# Patient Record
Sex: Male | Born: 1970
Health system: Southern US, Community
[De-identification: ages and names within clinical notes are randomized; demographics above are authoritative.]

## PROBLEM LIST (undated history)

## (undated) DIAGNOSIS — E119 Type 2 diabetes mellitus without complications: Secondary | ICD-10-CM

## (undated) DIAGNOSIS — I10 Essential (primary) hypertension: Secondary | ICD-10-CM

## (undated) DIAGNOSIS — I160 Hypertensive urgency: Secondary | ICD-10-CM

## (undated) HISTORY — PX: LACERATION REPAIR: SHX5168

## (undated) HISTORY — PX: SHOULDER SURGERY: SHX246

---

## 2010-09-19 ENCOUNTER — Emergency Department (HOSPITAL_COMMUNITY)
Admission: EM | Admit: 2010-09-19 | Discharge: 2010-09-19 | Disposition: A | Discharge status: Home / Self Care | Payer: PRIVATE HEALTH INSURANCE | Attending: Emergency Medicine | Admitting: Emergency Medicine

## 2010-09-19 DIAGNOSIS — R319 Hematuria, unspecified: Secondary | ICD-10-CM | POA: Insufficient documentation

## 2010-09-19 DIAGNOSIS — R51 Headache: Secondary | ICD-10-CM | POA: Insufficient documentation

## 2010-09-19 DIAGNOSIS — I10 Essential (primary) hypertension: Secondary | ICD-10-CM | POA: Insufficient documentation

## 2010-09-19 LAB — URINALYSIS, ROUTINE W REFLEX MICROSCOPIC
Bilirubin Urine: NEGATIVE
Leukocytes, UA: NEGATIVE
Nitrite: NEGATIVE
Specific Gravity, Urine: 1.02 (ref 1.005–1.030)
pH: 6.5 (ref 5.0–8.0)

## 2010-09-19 LAB — URINE MICROSCOPIC-ADD ON

## 2011-02-27 ENCOUNTER — Emergency Department (HOSPITAL_COMMUNITY)
Admission: EM | Admit: 2011-02-27 | Discharge: 2011-02-27 | Disposition: A | Discharge status: Home / Self Care | Payer: Worker's Compensation | Attending: Emergency Medicine | Admitting: Emergency Medicine

## 2011-02-27 ENCOUNTER — Emergency Department (HOSPITAL_COMMUNITY): Payer: Worker's Compensation

## 2011-02-27 ENCOUNTER — Encounter: Payer: Self-pay | Admitting: Emergency Medicine

## 2011-02-27 DIAGNOSIS — S29011A Strain of muscle and tendon of front wall of thorax, initial encounter: Secondary | ICD-10-CM

## 2011-02-27 DIAGNOSIS — IMO0002 Reserved for concepts with insufficient information to code with codable children: Secondary | ICD-10-CM | POA: Insufficient documentation

## 2011-02-27 DIAGNOSIS — I10 Essential (primary) hypertension: Secondary | ICD-10-CM | POA: Insufficient documentation

## 2011-02-27 DIAGNOSIS — X500XXA Overexertion from strenuous movement or load, initial encounter: Secondary | ICD-10-CM | POA: Insufficient documentation

## 2011-02-27 HISTORY — DX: Essential (primary) hypertension: I10

## 2011-02-27 MED ORDER — METHOCARBAMOL 500 MG PO TABS: ORAL_TABLET | ORAL | Status: DC

## 2011-02-27 MED ORDER — HYDROCODONE-ACETAMINOPHEN 7.5-325 MG PO TABS: ORAL_TABLET | ORAL | Status: DC

## 2011-02-27 NOTE — ED Notes (Signed)
Patient c/o left rib pain. Patient states "I was lifting some heavy freight this morning and heard a pop. I don't know if I cracked a rib or pulled a muscle but it hurts." Patient reports pain increases with deep breath.

## 2011-02-27 NOTE — ED Provider Notes (Signed)
History     Chief Complaint  Patient presents with  . Rib Injury   HPI Comments: Pt states he was lifting heavy freight this AM and injured his left lower rib area. No SOB, but pain with deep breathing.  Patient is a 40 y.o. male presenting with chest pain. The history is provided by the patient.  Chest Pain The chest pain began 3 - 5 hours ago. Chest pain occurs constantly. The chest pain is worsening. The pain is associated with breathing, coughing and lifting. At its most intense, the pain is at 10/10. The pain is currently at 10/10. The severity of the pain is severe. The quality of the pain is described as sharp. The pain does not radiate. Chest pain is worsened by certain positions and deep breathing. Pertinent negatives for primary symptoms include no fever, no fatigue, no syncope, no shortness of breath, no cough, no wheezing, no palpitations, no abdominal pain, no nausea, no vomiting, no dizziness and no altered mental status.  Pertinent negatives for associated symptoms include no claudication, no diaphoresis, no lower extremity edema, no near-syncope and no weakness. He tried nothing for the symptoms. Risk factors include smoking/tobacco exposure (hypertensive).  His past medical history is significant for hypertension.  Pertinent negatives for past medical history include no seizures.  Pertinent negatives for family medical history include: no early MI in family and no PE in family.     Past Medical History  Diagnosis Date  . Hypertension     Past Surgical History  Procedure Date  . Laceration repair     R thumb  . Laceration repair L inner thigh    Family History  Problem Relation Age of Onset  . COPD Mother   . Cancer Other     History  Substance Use Topics  . Smoking status: Never Smoker   . Smokeless tobacco: Never Used  . Alcohol Use: No      Review of Systems  Constitutional: Negative for fever, diaphoresis, activity change and fatigue.       All ROS  Neg except as noted in HPI  HENT: Negative for nosebleeds and neck pain.   Eyes: Negative for photophobia and discharge.  Respiratory: Negative for cough, chest tightness, shortness of breath and wheezing.   Cardiovascular: Positive for chest pain. Negative for palpitations, claudication, leg swelling, syncope and near-syncope.  Gastrointestinal: Negative for nausea, vomiting, abdominal pain and blood in stool.  Genitourinary: Negative for dysuria, frequency and hematuria.  Musculoskeletal: Negative for back pain and arthralgias.  Skin: Negative.   Neurological: Negative for dizziness, seizures, speech difficulty and weakness.  Psychiatric/Behavioral: Negative for hallucinations, confusion and altered mental status.    Physical Exam  BP 137/73  Pulse 66  Temp(Src) 97.7 F (36.5 C) (Oral)  Resp 20  Ht 5\' 10"  (1.778 m)  Wt 240 lb (108.863 kg)  BMI 34.44 kg/m2  SpO2 99%  Physical Exam  Nursing note and vitals reviewed. Constitutional: He is oriented to person, place, and time. He appears well-developed and well-nourished.  Non-toxic appearance.  HENT:  Head: Normocephalic.  Right Ear: Tympanic membrane and external ear normal.  Left Ear: Tympanic membrane and external ear normal.  Eyes: EOM and lids are normal. Pupils are equal, round, and reactive to light.  Neck: Normal range of motion. Neck supple. Carotid bruit is not present.  Cardiovascular: Normal rate, regular rhythm, normal heart sounds, intact distal pulses and normal pulses.   Pulmonary/Chest: Breath sounds normal. No respiratory distress. He exhibits  tenderness. He exhibits no mass, no crepitus, no deformity, no swelling and no retraction.  Abdominal: Soft. Bowel sounds are normal. There is no tenderness. There is no guarding.  Musculoskeletal: Normal range of motion.  Lymphadenopathy:       Head (right side): No submandibular adenopathy present.       Head (left side): No submandibular adenopathy present.    He has  no cervical adenopathy.  Neurological: He is alert and oriented to person, place, and time. He has normal strength. No cranial nerve deficit or sensory deficit.  Skin: Skin is warm and dry.  Psychiatric: He has a normal mood and affect. His speech is normal.    ED Course  Procedures  MDM I have reviewed nursing notes, vital signs, and all appropriate lab and imaging results for this patient.      Kathie Dike, [place] 02/27/11 1241

## 2011-02-27 NOTE — Discharge Instructions (Signed)
Your xrays are negative for fracture or acute problem. Please rest your chest wall. Use medications as suggested. Use with caution. These may cause drowsiness.Muscle Strain (Pulled Muscle) A pulled muscle happens when a muscle is over-stretched. Recovery usually takes 5 to 6 weeks.   HOME CARE   Put ice on the injured area.   Put ice in a plastic bag.   Place a towel between your skin and the bag.   Leave the ice on for 20 minutes at a time, every hour for the first 2 days.   Do not use the pulled muscle for several days or until your doctor says you can. Do not use the muscle if you have pain.   Wrap the injured area with an elastic bandage for comfort. Do not to put it on too tightly.   Only take medicine as told by your doctor.   Warm up before exercise. This helps prevent muscle strains.  GET HELP IF: There is increased pain or puffiness (swelling) in the affected area. MAKE SURE YOU:   Understand these instructions.   Will watch your condition.   Will get help right away if you are not doing well or get worse.  Document Released: 04/27/2008 Document Re-Released: 01/06/2010 [facility] Patient Information 2011 [place], [place].

## 2011-02-27 NOTE — ED Notes (Signed)
Pt was moving heavy equipment this am at work when he felt pain in his left rib area, pain is increased with resp. And movement, lung sounds clear and equal bilateral

## 2011-04-11 NOTE — ED Provider Notes (Signed)
Medical screening examination/treatment/procedure(s) were performed by non-physician practitioner and as supervising physician I was immediately available for consultation/collaboration.  Doug Sou, MD 04/11/11 [phone]

## 2011-05-06 ENCOUNTER — Emergency Department (HOSPITAL_COMMUNITY): Payer: PRIVATE HEALTH INSURANCE

## 2011-05-06 ENCOUNTER — Emergency Department (HOSPITAL_COMMUNITY)
Admission: EM | Admit: 2011-05-06 | Discharge: 2011-05-07 | Disposition: A | Discharge status: Home / Self Care | Payer: PRIVATE HEALTH INSURANCE | Attending: Emergency Medicine | Admitting: Emergency Medicine

## 2011-05-06 ENCOUNTER — Encounter (HOSPITAL_COMMUNITY): Payer: Self-pay | Admitting: *Deleted

## 2011-05-06 DIAGNOSIS — R110 Nausea: Secondary | ICD-10-CM | POA: Insufficient documentation

## 2011-05-06 DIAGNOSIS — R059 Cough, unspecified: Secondary | ICD-10-CM | POA: Insufficient documentation

## 2011-05-06 DIAGNOSIS — E876 Hypokalemia: Secondary | ICD-10-CM | POA: Insufficient documentation

## 2011-05-06 DIAGNOSIS — R51 Headache: Secondary | ICD-10-CM | POA: Insufficient documentation

## 2011-05-06 DIAGNOSIS — Z79899 Other long term (current) drug therapy: Secondary | ICD-10-CM | POA: Insufficient documentation

## 2011-05-06 DIAGNOSIS — R05 Cough: Secondary | ICD-10-CM | POA: Insufficient documentation

## 2011-05-06 DIAGNOSIS — R198 Other specified symptoms and signs involving the digestive system and abdomen: Secondary | ICD-10-CM | POA: Insufficient documentation

## 2011-05-06 DIAGNOSIS — K5900 Constipation, unspecified: Secondary | ICD-10-CM | POA: Insufficient documentation

## 2011-05-06 DIAGNOSIS — R1032 Left lower quadrant pain: Secondary | ICD-10-CM | POA: Insufficient documentation

## 2011-05-06 LAB — COMPREHENSIVE METABOLIC PANEL
AST: 26 U/L (ref 0–37)
Albumin: 3.8 g/dL (ref 3.5–5.2)
Alkaline Phosphatase: 60 U/L (ref 39–117)
Chloride: 95 mEq/L — ABNORMAL LOW (ref 96–112)
Creatinine, Ser: 1.05 mg/dL (ref 0.50–1.35)
Potassium: 3 mEq/L — ABNORMAL LOW (ref 3.5–5.1)
Total Bilirubin: 1.4 mg/dL — ABNORMAL HIGH (ref 0.3–1.2)

## 2011-05-06 LAB — CBC
MCHC: 35.7 g/dL (ref 30.0–36.0)
RDW: 13.1 % (ref 11.5–15.5)

## 2011-05-06 LAB — DIFFERENTIAL
Basophils Absolute: 0 10*3/uL (ref 0.0–0.1)
Basophils Relative: 1 % (ref 0–1)
Neutro Abs: 4.7 10*3/uL (ref 1.7–7.7)
Neutrophils Relative %: 66 % (ref 43–77)

## 2011-05-06 LAB — LIPASE, BLOOD: Lipase: 41 U/L (ref 11–59)

## 2011-05-06 MED ORDER — IOHEXOL 300 MG/ML  SOLN
100.0000 mL | Freq: Once | INTRAMUSCULAR | Status: AC | PRN
  Administered 2011-05-06: 100 mL via INTRAVENOUS

## 2011-05-06 MED ORDER — ONDANSETRON HCL 4 MG/2ML IJ SOLN
4.0000 mg | Freq: Once | INTRAMUSCULAR | Status: AC
  Administered 2011-05-06: 4 mg via INTRAVENOUS
  Filled 2011-05-06: qty 2

## 2011-05-06 MED ORDER — MORPHINE SULFATE 4 MG/ML IJ SOLN
4.0000 mg | Freq: Once | INTRAMUSCULAR | Status: AC
  Administered 2011-05-06: 4 mg via INTRAVENOUS
  Filled 2011-05-06: qty 1

## 2011-05-06 MED ORDER — IBUPROFEN 800 MG PO TABS
800.0000 mg | ORAL_TABLET | Freq: Once | ORAL | Status: AC
  Administered 2011-05-06: 800 mg via ORAL
  Filled 2011-05-06: qty 1

## 2011-05-06 MED ORDER — SODIUM CHLORIDE 0.9 % IV BOLUS (SEPSIS)
1000.0000 mL | Freq: Once | INTRAVENOUS | Status: AC
  Administered 2011-05-06: 1000 mL via INTRAVENOUS

## 2011-05-06 NOTE — ED Notes (Signed)
Pt reports nausea, headache and chest pain x 3 days

## 2011-05-06 NOTE — ED Notes (Signed)
Patient moved in room 7; c/o nausea at this time.  Family member at bedside.

## 2011-05-07 ENCOUNTER — Encounter (HOSPITAL_COMMUNITY): Payer: Self-pay | Admitting: Emergency Medicine

## 2011-05-07 LAB — URINALYSIS, ROUTINE W REFLEX MICROSCOPIC
Glucose, UA: NEGATIVE mg/dL
pH: 6 (ref 5.0–8.0)

## 2011-05-07 LAB — URINE MICROSCOPIC-ADD ON

## 2011-05-07 MED ORDER — POTASSIUM CHLORIDE CRYS ER 20 MEQ PO TBCR
40.0000 meq | EXTENDED_RELEASE_TABLET | Freq: Once | ORAL | Status: AC
  Administered 2011-05-07: 40 meq via ORAL
  Filled 2011-05-07: qty 2

## 2011-05-07 MED ORDER — POTASSIUM CHLORIDE ER 10 MEQ PO TBCR: 20.0000 meq | EXTENDED_RELEASE_TABLET | Freq: Two times a day (BID) | ORAL | Status: DC

## 2011-05-07 MED ORDER — ONDANSETRON HCL 4 MG PO TABS: 4.0000 mg | ORAL_TABLET | Freq: Four times a day (QID) | ORAL | Status: AC

## 2011-05-07 NOTE — ED Provider Notes (Signed)
History     CSN: [id number] Arrival date & time: 05/06/2011  8:09 PM  Chief Complaint  Patient presents with  . Headache  . Nausea    (Consider location/radiation/quality/duration/timing/severity/associated sxs/prior treatment) HPI Comments: Patient c/o left lower abdominal pain and nausea for 3 days.  Describes the pain as persistent, sharp and worsens when he tries to eat.  He also reports intermittent headaches, cough and chest congestion for several days.  He denies vomiting but states the nausea has been severe and his appetite has decreased.  He also denies fever, diarrhea, dysuria, or flank pain.  He also denies hematuria, shortness of breath, chest pain, neck pain, or visual changes  Patient is a 40 y.o. male presenting with abdominal pain. The history is provided by the patient.  Abdominal Pain The primary symptoms of the illness include abdominal pain and nausea. The primary symptoms of the illness do not include fever, fatigue, shortness of breath, vomiting, diarrhea, hematochezia or dysuria. The current episode started more than 2 days ago. The onset of the illness was gradual. The problem has not changed since onset. The pain came on gradually. The abdominal pain has been unchanged since its onset. The abdominal pain is located in the LLQ. The abdominal pain does not radiate. The abdominal pain is relieved by nothing. Exacerbated by: nothing.  Nausea began 3 to 5 days ago. The nausea is associated with eating. The nausea is exacerbated by food.  The patient has had a change in bowel habit. Additional symptoms associated with the illness include constipation. Symptoms associated with the illness do not include chills, diaphoresis, heartburn, urgency, hematuria, frequency or back pain. Significant associated medical issues do not include PUD, diabetes, substance abuse or cardiac disease.    Past Medical History  Diagnosis Date  . Hypertension     Past Surgical History    Procedure Date  . Laceration repair     R thumb  . Laceration repair L inner thigh    Family History  Problem Relation Age of Onset  . COPD Mother   . Cancer Other     History  Substance Use Topics  . Smoking status: Never Smoker   . Smokeless tobacco: Never Used  . Alcohol Use: No      Review of Systems  Constitutional: Positive for appetite change. Negative for fever, chills, diaphoresis, activity change and fatigue.  HENT: Positive for congestion. Negative for sore throat, trouble swallowing, neck pain and neck stiffness.   Respiratory: Positive for cough. Negative for chest tightness, shortness of breath and wheezing.   Cardiovascular: Negative for chest pain, palpitations and leg swelling.  Gastrointestinal: Positive for nausea, abdominal pain and constipation. Negative for heartburn, vomiting, diarrhea, blood in stool, hematochezia and anal bleeding.  Genitourinary: Negative for dysuria, urgency, frequency, hematuria, flank pain, decreased urine volume and difficulty urinating.  Musculoskeletal: Negative for myalgias, back pain and arthralgias.  Skin: Negative for rash.  Neurological: Positive for headaches. Negative for dizziness, facial asymmetry, weakness and numbness.  Hematological: Does not bruise/bleed easily.  All other systems reviewed and are negative.    Allergies  Penicillins and Mushroom extract complex  Home Medications   Current Outpatient Rx  Name Route Sig Dispense Refill  . CLONIDINE HCL 0.2 MG PO TABS Oral Take 0.2 mg by mouth 2 (two) times daily.      Marland Kitchen LISINOPRIL 20 MG PO TABS Oral Take 20 mg by mouth daily.      Marland Kitchen HYDROCODONE-ACETAMINOPHEN 7.5-325 MG PO TABS  1 po q4h prn pain 20 tablet 0  . IBUPROFEN 200 MG PO TABS Oral Take 400 mg by mouth every 6 (six) hours as needed. Pain     . METHOCARBAMOL 500 MG PO TABS  2 po tid for spasm 30 tablet 0    BP 130/104  Pulse 83  Temp(Src) 98.5 F (36.9 C) (Oral)  Resp 16  Ht 5\' 10"  (1.778 m)   Wt 230 lb (104.327 kg)  BMI 33.00 kg/m2  SpO2 99%  Physical Exam  Nursing note and vitals reviewed. Constitutional: He is oriented to person, place, and time. He appears well-developed and well-nourished. No distress.  HENT:  Head: Normocephalic and atraumatic.  Mouth/Throat: Oropharynx is clear and moist.  Eyes: EOM are normal. Pupils are equal, round, and reactive to light.  Neck: Normal range of motion. Neck supple.  Cardiovascular: Normal rate, regular rhythm and normal heart sounds.   No murmur heard. Pulmonary/Chest: Effort normal and breath sounds normal. No respiratory distress. He exhibits no tenderness.  Abdominal: Soft. Bowel sounds are normal. He exhibits no distension and no mass. There is no hepatosplenomegaly. There is tenderness in the left lower quadrant. There is no rigidity, no rebound, no guarding and no CVA tenderness.  Musculoskeletal: Normal range of motion. He exhibits no edema and no tenderness.  Lymphadenopathy:    He has no cervical adenopathy.  Neurological: He is alert and oriented to person, place, and time. No cranial nerve deficit. He exhibits normal muscle tone. Coordination normal.  Skin: Skin is warm and dry.    ED Course  Procedures (including critical care time)  Results for orders placed during the hospital encounter of 05/06/11  CBC      Component Value Range   WBC 7.1  4.0 - 10.5 (K/uL)   RBC 5.17  4.22 - 5.81 (MIL/uL)   Hemoglobin 15.3  13.0 - 17.0 (g/dL)   HCT 16.1  09.6 - 04.5 (%)   MCV 83.0  78.0 - 100.0 (fL)   MCH 29.6  26.0 - 34.0 (pg)   MCHC 35.7  30.0 - 36.0 (g/dL)   RDW 40.9  81.1 - 91.4 (%)   Platelets 180  150 - 400 (K/uL)  DIFFERENTIAL      Component Value Range   Neutrophils Relative 66  43 - 77 (%)   Neutro Abs 4.7  1.7 - 7.7 (K/uL)   Lymphocytes Relative 21  12 - 46 (%)   Lymphs Abs 1.5  0.7 - 4.0 (K/uL)   Monocytes Relative 12  3 - 12 (%)   Monocytes Absolute 0.9  0.1 - 1.0 (K/uL)   Eosinophils Relative 0  0 - 5 (%)    Eosinophils Absolute 0.0  0.0 - 0.7 (K/uL)   Basophils Relative 1  0 - 1 (%)   Basophils Absolute 0.0  0.0 - 0.1 (K/uL)  COMPREHENSIVE METABOLIC PANEL      Component Value Range   Sodium 131 (*) 135 - 145 (mEq/L)   Potassium 3.0 (*) 3.5 - 5.1 (mEq/L)   Chloride 95 (*) 96 - 112 (mEq/L)   CO2 25  19 - 32 (mEq/L)   Glucose, Bld 96  70 - 99 (mg/dL)   BUN 17  6 - 23 (mg/dL)   Creatinine, Ser 7.82  0.50 - 1.35 (mg/dL)   Calcium 9.0  8.4 - 95.6 (mg/dL)   Total Protein 7.3  6.0 - 8.3 (g/dL)   Albumin 3.8  3.5 - 5.2 (g/dL)   AST 26  0 -  37 (U/L)   ALT 44  0 - 53 (U/L)   Alkaline Phosphatase 60  39 - 117 (U/L)   Total Bilirubin 1.4 (*) 0.3 - 1.2 (mg/dL)   GFR calc non Af Amer 86 (*) >90 (mL/min)   GFR calc Af Amer >90  >90 (mL/min)  URINALYSIS, ROUTINE W REFLEX MICROSCOPIC      Component Value Range   Color, Urine ORANGE (*) YELLOW    Appearance CLEAR  CLEAR    Specific Gravity, Urine 1.020  1.005 - 1.030    pH 6.0  5.0 - 8.0    Glucose, UA NEGATIVE  NEGATIVE (mg/dL)   Hgb urine dipstick TRACE (*) NEGATIVE    Bilirubin Urine SMALL (*) NEGATIVE    Ketones, ur TRACE (*) NEGATIVE (mg/dL)   Protein, ur TRACE (*) NEGATIVE (mg/dL)   Urobilinogen, UA >4.0 (*) 0.0 - 1.0 (mg/dL)   Nitrite NEGATIVE  NEGATIVE    Leukocytes, UA NEGATIVE  NEGATIVE   LIPASE, BLOOD      Component Value Range   Lipase 41  11 - 59 (U/L)  URINE MICROSCOPIC-ADD ON      Component Value Range   WBC, UA 3-6  <3 (WBC/hpf)   RBC / HPF 3-6  <3 (RBC/hpf)   Bacteria, UA RARE  RARE    Casts HYALINE CASTS (*) NEGATIVE     Dg Chest 2 View  05/06/2011  *RADIOLOGY REPORT*  Clinical Data: Nausea, headache and chest pain.  CHEST - 2 VIEW  Comparison: 02/27/2011.  Findings: There is an ill-defined nodular density overlying the right anterior fifth rib end.  Follow-up noncontrast chest CT recommended.  There is abnormal articulation between the first and second right ribs, which is unchanged compared to prior.  There is no  airspace disease.  No effusion.  Cardiopericardial silhouette is normal.  Mediastinal contours normal.  IMPRESSION:  1.  No acute cardiopulmonary disease. 2.  Ill-defined pulmonary nodule measuring 8 mm overlying the right anterior fifth rib end.  Follow-up nonemergent noncontrast chest CT recommended to further assess.  Original Report Authenticated By: Andreas Newport, M.D.   Ct Abdomen Pelvis W Contrast  05/07/2011  *RADIOLOGY REPORT*  Clinical Data: Nausea and diffuse abdominal pain  CT ABDOMEN AND PELVIS WITH CONTRAST  Technique:  Multidetector CT imaging of the abdomen and pelvis was performed following the standard protocol during bolus administration of intravenous contrast.  Contrast: OMNIPAQUE IOHEXOL 300 MG/ML IV SOLN  Comparison: None.  Findings:  Normal hepatic contour. There is diffuse mildly decreased attenuation of the hepatic parenchyma, nonspecific but suggestive of hepatic steatosis.  No discrete hyper hypoattenuating hepatic lesions.  Normal gallbladder.  No intra or extrahepatic biliary duct dilatation.  No ascites.  There is symmetric enhancement and excretion of the bilateral kidneys.  No evidence of urinary obstruction.  There is an approximately 5 mm nonobstructing stone within a superior calix of the left kidney (coronal image 71, series 4).  Bilateral sub centimeter hypoattenuating renal lesionsm too small to accurately characterize though favored to represent renal cysts.  No perinephric stranding.  Bilateral adrenal glands are normal. Normal pancreas and spleen.  Incidental note is made of a small splenule.  Enteric contrast extends to the mid/distal small bowel.  No evidence of enteric obstruction.  The sigmoid colon is noted to be redundant but free of significant diverticular disease.  The bowel is otherwise normal in course and caliber without wall thickening or evidence of obstruction.  Normal appendix.  No pneumoperitoneum, pneumatosis or  portal venous gas.  Normal caliber  abdominal aorta. The major branch vessels of the abdominal aorta are patent.  No retroperitoneal, mesenteric, pelvic or inguinal lymphadenopathy. Pelvic organs are normal.  No free fluid within the pelvis  Limited visualization of the lower thorax is negative for focal airspace opacity or pleural effusion.  Normal heart size.  No pericardial effusion.  No acute aggressive osseous abnormalities.  IMPRESSION: 1.  No explanation for the patient's diffuse abdominal pain and nausea.  Specifically, no evidence of urinary or enteric obstruction. 2.  Nonobstructing left-sided 5 mm renal stone. 3.  Findings suggestive of hepatic steatosis.  Original Report Authenticated By: Waynard Reeds, M.D.        MDM     Patient is feeling better, has received IVF's and nausea resolved after the zofran.  I have discussed tonight lab and imagining results with the patient including the total bili.  He stated that he was told by one of his previous provider's that his liver functions were slightly elevated in the past.    I have recommended he have close f/u with his PMD for recheck and he agrees to care plan and verbalized understanding.     Pt feels improved after observation and/or treatment in ED.     Telisha Zawadzki L. Aquilla Voiles, PA 05/07/11 0116  Josean Lycan L. [place], [place] 05/07/11 [phone]

## 2011-05-07 NOTE — ED Provider Notes (Signed)
Medical screening examination/treatment/procedure(s) were performed by non-physician practitioner and as supervising physician I was immediately available for consultation/collaboration.  Glynn Octave, MD 05/07/11 1201

## 2011-07-07 ENCOUNTER — Encounter: Payer: Self-pay | Admitting: Urgent Care

## 2011-07-16 ENCOUNTER — Ambulatory Visit: Payer: Self-pay | Admitting: Urgent Care

## 2011-07-16 ENCOUNTER — Telehealth: Payer: Self-pay | Admitting: Urgent Care

## 2011-07-16 NOTE — Telephone Encounter (Signed)
Pt was a no show

## 2011-12-25 ENCOUNTER — Emergency Department (HOSPITAL_COMMUNITY)
Admission: EM | Admit: 2011-12-25 | Discharge: 2011-12-25 | Disposition: A | Discharge status: Home / Self Care | Payer: Worker's Compensation | Attending: Emergency Medicine | Admitting: Emergency Medicine

## 2011-12-25 ENCOUNTER — Emergency Department (HOSPITAL_COMMUNITY): Payer: Worker's Compensation

## 2011-12-25 ENCOUNTER — Encounter (HOSPITAL_COMMUNITY): Payer: Self-pay | Admitting: *Deleted

## 2011-12-25 DIAGNOSIS — X58XXXA Exposure to other specified factors, initial encounter: Secondary | ICD-10-CM | POA: Insufficient documentation

## 2011-12-25 DIAGNOSIS — S335XXA Sprain of ligaments of lumbar spine, initial encounter: Secondary | ICD-10-CM | POA: Insufficient documentation

## 2011-12-25 DIAGNOSIS — R209 Unspecified disturbances of skin sensation: Secondary | ICD-10-CM | POA: Insufficient documentation

## 2011-12-25 DIAGNOSIS — M545 Low back pain, unspecified: Secondary | ICD-10-CM | POA: Insufficient documentation

## 2011-12-25 DIAGNOSIS — M5430 Sciatica, unspecified side: Secondary | ICD-10-CM | POA: Insufficient documentation

## 2011-12-25 DIAGNOSIS — I10 Essential (primary) hypertension: Secondary | ICD-10-CM | POA: Insufficient documentation

## 2011-12-25 DIAGNOSIS — M79609 Pain in unspecified limb: Secondary | ICD-10-CM | POA: Insufficient documentation

## 2011-12-25 MED ORDER — OXYCODONE-ACETAMINOPHEN 5-325 MG PO TABS
1.0000 | ORAL_TABLET | Freq: Four times a day (QID) | ORAL | Status: AC | PRN
Start: 2011-12-25 — End: 2012-01-04

## 2011-12-25 MED ORDER — METHYLPREDNISOLONE SODIUM SUCC 125 MG IJ SOLR
125.0000 mg | Freq: Once | INTRAMUSCULAR | Status: AC
Start: 2011-12-25 — End: 2011-12-25
  Administered 2011-12-25: 125 mg via INTRAVENOUS
  Filled 2011-12-25: qty 2

## 2011-12-25 MED ORDER — PREDNISONE 20 MG PO TABS: ORAL_TABLET | ORAL | Status: DC

## 2011-12-25 MED ORDER — CYCLOBENZAPRINE HCL 10 MG PO TABS: 10.0000 mg | ORAL_TABLET | Freq: Three times a day (TID) | ORAL | Status: AC | PRN

## 2011-12-25 MED ORDER — HYDROMORPHONE HCL PF 1 MG/ML IJ SOLN
1.0000 mg | Freq: Once | INTRAMUSCULAR | Status: AC
  Administered 2011-12-25: 1 mg via INTRAVENOUS
  Filled 2011-12-25: qty 1

## 2011-12-25 NOTE — ED Notes (Signed)
Pt c/o pain in his entire back and numbness and tingling in his legs since unloading paper at work at 5 am. Pt states that the numbness is worse in his right leg.

## 2011-12-25 NOTE — ED Notes (Signed)
Pt a/ox4. Resp even and unlabored. NAD at this time. D/C instructions reviewed with pt. Pt verbalized understanding. Pt to d/c desk via w/c. Mother with pt to transport home.

## 2011-12-25 NOTE — ED Notes (Signed)
Patient reports lower back pain that radiates to right leg after trying to lift a palate of paper this morning around 0500. Patient states the paper shifted and he was trying to right the stack. Patient states pain in legs is tingling and numb. Reports his legs "feel like they want to buckle" Patient denies loss of bowel/bladder control. Sitting up in stretcher at this time.

## 2011-12-25 NOTE — ED Notes (Signed)
Dr Zammit at bedside. 

## 2011-12-25 NOTE — ED Provider Notes (Signed)
History  Scribed for Jeffrey Lennert, MD, the patient was seen in room APA08/APA08. This chart was scribed by Jeffrey Potts. The patient's care started at 12:19 PM    CSN: [id number]  Arrival date & time 12/25/11  1134   First MD Initiated Contact with Patient 12/25/11 1215      Chief Complaint  Patient presents with  . Back Pain    Patient is a 41 y.o. male presenting with back pain.  Back Pain  This is a new problem. The current episode started 6 to 12 hours ago. The problem occurs constantly. The problem has not changed since onset.The pain is associated with lifting heavy objects. The pain is present in the lumbar spine. Radiates to: down both legs. The pain is moderate. The symptoms are aggravated by certain positions. Associated symptoms include numbness (numbness and tingling in both legs). He has tried nothing for the symptoms. The treatment provided no relief.   Jeffrey Potts is a 41 y.o. male who presents to the Emergency Department complaining of back pain after lifting a heavy object this morning.  Pt states that he is also experiencing numbness, pain, and tingling in his legs with his right leg feeling worse.  Nothing seems to make the pain better or worse.   Past Medical History  Diagnosis Date  . Hypertension     Past Surgical History  Procedure Date  . Laceration repair     R thumb  . Laceration repair L inner thigh    Family History  Problem Relation Age of Onset  . COPD Mother   . Cancer Other     History  Substance Use Topics  . Smoking status: Never Smoker   . Smokeless tobacco: Never Used  . Alcohol Use: No      Review of Systems  HENT: Positive for neck pain.   Musculoskeletal: Positive for back pain.  Neurological: Positive for numbness (numbness and tingling in both legs).  All other systems reviewed and are negative.    Allergies  Penicillins and Mushroom extract complex  Home Medications   Current Outpatient Rx  Name Route Sig  Dispense Refill  . CLONIDINE HCL 0.2 MG PO TABS Oral Take 0.2 mg by mouth 2 (two) times daily.      Marland Kitchen HYDROCODONE-ACETAMINOPHEN 7.5-325 MG PO TABS  1 po q4h prn pain 20 tablet 0  . IBUPROFEN 200 MG PO TABS Oral Take 400 mg by mouth every 6 (six) hours as needed. Pain     . LISINOPRIL 20 MG PO TABS Oral Take 20 mg by mouth daily.      Marland Kitchen METHOCARBAMOL 500 MG PO TABS  2 po tid for spasm 30 tablet 0  . POTASSIUM CHLORIDE ER 10 MEQ PO TBCR Oral Take 2 tablets (20 mEq total) by mouth 2 (two) times daily. 8 tablet 0    BP 140/101  Pulse 91  Temp(Src) 98.7 F (37.1 C) (Oral)  Resp 20  Ht 5\' 10"  (1.778 m)  Wt 240 lb (108.863 kg)  BMI 34.44 kg/m2  SpO2 100%  Physical Exam  Nursing note and vitals reviewed. Constitutional: He is oriented to person, place, and time. He appears well-developed and well-nourished. No distress.  HENT:  Head: Normocephalic and atraumatic.  Eyes: EOM are normal. Pupils are equal, round, and reactive to light.  Neck: Neck supple. No tracheal deviation present.  Cardiovascular: Normal rate.   Pulmonary/Chest: Effort normal. No respiratory distress.  Abdominal: Soft. He exhibits no distension.  Musculoskeletal:  Normal range of motion. He exhibits tenderness. He exhibits no edema.       Tenderness in lumbar spine.  Decreases reflexes distally bilaterally.  Straight leg raise positive on the right side.   Neurological: He is alert and oriented to person, place, and time. No sensory deficit.  Skin: Skin is warm and dry.  Psychiatric: He has a normal mood and affect. His behavior is normal.    ED Course  Procedures   DIAGNOSTIC STUDIES: Oxygen Saturation is 100% on room air, normal by my interpretation.    COORDINATION OF CARE:     Labs Reviewed - No data to display Dg Lumbar Spine Complete  12/25/2011  *RADIOLOGY REPORT*  Clinical Data: Back pain.  LUMBAR SPINE - COMPLETE 4+ VIEW  Comparison: 05/06/2011  Findings: 6 mm left kidney upper pole renal calculus  noted.  The T12 ribs are hypoplastic.  Mild bilateral facet arthropathy noted at L5-S1.  No lumbar spine fracture or significant malalignment observed.  IMPRESSION:  1.  Bilateral facet arthropathy at L5-S1. 2.  Left kidney upper pole calculus.  Original Report Authenticated By: Dellia Cloud, M.D.     No diagnosis found.  Pt improved with tx  MDM  Lumbar strain with sciatica  The chart was scribed for me under my direct supervision.  I personally performed the history, physical, and medical decision making and all procedures in the evaluation of this patient.Jeffrey Lennert, MD 12/25/11 [phone]

## 2011-12-25 NOTE — Discharge Instructions (Signed)
Rest at home and follow up with your md tuesday

## 2014-03-12 ENCOUNTER — Ambulatory Visit: Payer: Self-pay

## 2015-01-25 ENCOUNTER — Emergency Department (HOSPITAL_COMMUNITY): Payer: BLUE CROSS/BLUE SHIELD

## 2015-01-25 ENCOUNTER — Encounter (HOSPITAL_COMMUNITY): Payer: Self-pay | Admitting: Emergency Medicine

## 2015-01-25 ENCOUNTER — Inpatient Hospital Stay (HOSPITAL_COMMUNITY)
Admission: EM | Admit: 2015-01-25 | Discharge: 2015-01-26 | DRG: 305 | Disposition: A | Discharge status: Home / Self Care | Payer: BLUE CROSS/BLUE SHIELD | Attending: Internal Medicine | Admitting: Internal Medicine

## 2015-01-25 DIAGNOSIS — Z825 Family history of asthma and other chronic lower respiratory diseases: Secondary | ICD-10-CM

## 2015-01-25 DIAGNOSIS — R42 Dizziness and giddiness: Secondary | ICD-10-CM | POA: Diagnosis not present

## 2015-01-25 DIAGNOSIS — Z9119 Patient's noncompliance with other medical treatment and regimen: Secondary | ICD-10-CM | POA: Diagnosis present

## 2015-01-25 DIAGNOSIS — R9431 Abnormal electrocardiogram [ECG] [EKG]: Secondary | ICD-10-CM

## 2015-01-25 DIAGNOSIS — I10 Essential (primary) hypertension: Principal | ICD-10-CM | POA: Diagnosis present

## 2015-01-25 DIAGNOSIS — R531 Weakness: Secondary | ICD-10-CM | POA: Diagnosis present

## 2015-01-25 DIAGNOSIS — I160 Hypertensive urgency: Secondary | ICD-10-CM | POA: Diagnosis present

## 2015-01-25 DIAGNOSIS — E669 Obesity, unspecified: Secondary | ICD-10-CM | POA: Diagnosis not present

## 2015-01-25 DIAGNOSIS — R519 Headache, unspecified: Secondary | ICD-10-CM

## 2015-01-25 DIAGNOSIS — R51 Headache: Secondary | ICD-10-CM

## 2015-01-25 HISTORY — DX: Hypertensive urgency: I16.0

## 2015-01-25 LAB — CBC
HCT: 45.6 % (ref 39.0–52.0)
Hemoglobin: 15.8 g/dL (ref 13.0–17.0)
MCH: 29.6 pg (ref 26.0–34.0)
MCHC: 34.6 g/dL (ref 30.0–36.0)
MCV: 85.4 fL (ref 78.0–100.0)
PLATELETS: 250 10*3/uL (ref 150–400)
RBC: 5.34 MIL/uL (ref 4.22–5.81)
RDW: 13.2 % (ref 11.5–15.5)
WBC: 9.3 10*3/uL (ref 4.0–10.5)

## 2015-01-25 LAB — COMPREHENSIVE METABOLIC PANEL
ALK PHOS: 115 U/L (ref 38–126)
ALT: 62 U/L (ref 17–63)
ANION GAP: 9 (ref 5–15)
AST: 34 U/L (ref 15–41)
Albumin: 4.2 g/dL (ref 3.5–5.0)
BILIRUBIN TOTAL: 0.7 mg/dL (ref 0.3–1.2)
BUN: 19 mg/dL (ref 6–20)
CO2: 24 mmol/L (ref 22–32)
Calcium: 8.9 mg/dL (ref 8.9–10.3)
Chloride: 103 mmol/L (ref 101–111)
Creatinine, Ser: 1 mg/dL (ref 0.61–1.24)
GFR calc non Af Amer: >60 mL/min (ref >60)
Glucose, Bld: 106 mg/dL — ABNORMAL HIGH (ref 65–99)
Potassium: 3.4 mmol/L — ABNORMAL LOW (ref 3.5–5.1)
Sodium: 136 mmol/L (ref 135–145)
Total Protein: 7.7 g/dL (ref 6.5–8.1)

## 2015-01-25 LAB — TSH: TSH: 1.603 u[IU]/mL (ref 0.350–4.500)

## 2015-01-25 LAB — MAGNESIUM: Magnesium: 2 mg/dL (ref 1.7–2.4)

## 2015-01-25 LAB — I-STAT TROPONIN, ED: Troponin i, poc: 0.02 ng/mL (ref 0.00–0.08)

## 2015-01-25 LAB — TROPONIN I
TROPONIN I: 0.03 ng/mL (ref <0.031)
Troponin I: <0.03 ng/mL (ref <0.031)

## 2015-01-25 MED ORDER — HYDROCODONE-ACETAMINOPHEN 5-325 MG PO TABS: 1.0000 | ORAL_TABLET | ORAL | Status: DC | PRN

## 2015-01-25 MED ORDER — POTASSIUM CHLORIDE CRYS ER 20 MEQ PO TBCR
20.0000 meq | EXTENDED_RELEASE_TABLET | Freq: Every day | ORAL | Status: DC
  Administered 2015-01-25 – 2015-01-26 (×2): 20 meq via ORAL
  Filled 2015-01-25 (×2): qty 1

## 2015-01-25 MED ORDER — ACETAMINOPHEN 325 MG PO TABS
650.0000 mg | ORAL_TABLET | Freq: Four times a day (QID) | ORAL | Status: DC | PRN
  Administered 2015-01-25: 650 mg via ORAL
  Filled 2015-01-25: qty 2

## 2015-01-25 MED ORDER — CLONIDINE HCL 0.2 MG PO TABS
0.2000 mg | ORAL_TABLET | Freq: Two times a day (BID) | ORAL | Status: DC
  Administered 2015-01-25 – 2015-01-26 (×3): 0.2 mg via ORAL
  Filled 2015-01-25 (×3): qty 1

## 2015-01-25 MED ORDER — ONDANSETRON HCL 4 MG/2ML IJ SOLN: 4.0000 mg | Freq: Four times a day (QID) | INTRAMUSCULAR | Status: DC | PRN

## 2015-01-25 MED ORDER — ALBUTEROL SULFATE (2.5 MG/3ML) 0.083% IN NEBU
2.5000 mg | INHALATION_SOLUTION | RESPIRATORY_TRACT | Status: DC | PRN
Start: 2015-01-25 — End: 2015-01-26

## 2015-01-25 MED ORDER — CLONIDINE HCL 0.1 MG/24HR TD PTWK
0.2000 mg | MEDICATED_PATCH | Freq: Once | TRANSDERMAL | Status: DC
  Filled 2015-01-25: qty 2

## 2015-01-25 MED ORDER — ONDANSETRON HCL 4 MG PO TABS: 4.0000 mg | ORAL_TABLET | Freq: Four times a day (QID) | ORAL | Status: DC | PRN

## 2015-01-25 MED ORDER — ALUM & MAG HYDROXIDE-SIMETH 200-200-20 MG/5ML PO SUSP: 30.0000 mL | Freq: Four times a day (QID) | ORAL | Status: DC | PRN

## 2015-01-25 MED ORDER — ACETAMINOPHEN 650 MG RE SUPP: 650.0000 mg | Freq: Four times a day (QID) | RECTAL | Status: DC | PRN

## 2015-01-25 MED ORDER — HYDROCHLOROTHIAZIDE 25 MG PO TABS
25.0000 mg | ORAL_TABLET | Freq: Every day | ORAL | Status: DC
  Administered 2015-01-25 – 2015-01-26 (×2): 25 mg via ORAL
  Filled 2015-01-25 (×2): qty 1

## 2015-01-25 MED ORDER — ASPIRIN EC 81 MG PO TBEC
81.0000 mg | DELAYED_RELEASE_TABLET | Freq: Every day | ORAL | Status: DC
  Administered 2015-01-25 – 2015-01-26 (×2): 81 mg via ORAL
  Filled 2015-01-25 (×2): qty 1

## 2015-01-25 MED ORDER — NICARDIPINE HCL IN NACL 20-0.86 MG/200ML-% IV SOLN
3.0000 mg/h | Freq: Once | INTRAVENOUS | Status: AC
Start: 2015-01-25 — End: 2015-01-25
  Administered 2015-01-25: 5 mg/h via INTRAVENOUS
  Filled 2015-01-25: qty 200

## 2015-01-25 MED ORDER — HYDRALAZINE HCL 20 MG/ML IJ SOLN
5.0000 mg | INTRAMUSCULAR | Status: DC | PRN
Start: 2015-01-25 — End: 2015-01-26

## 2015-01-25 MED ORDER — SODIUM CHLORIDE 0.9 % IV SOLN
INTRAVENOUS | Status: DC
  Administered 2015-01-25: 14:00:00 via INTRAVENOUS

## 2015-01-25 MED ORDER — LISINOPRIL-HYDROCHLOROTHIAZIDE 20-25 MG PO TABS: 1.0000 | ORAL_TABLET | Freq: Every day | ORAL | Status: DC

## 2015-01-25 MED ORDER — CLONIDINE HCL 0.2 MG PO TABS
0.2000 mg | ORAL_TABLET | Freq: Three times a day (TID) | ORAL | Status: AC | PRN
  Administered 2015-01-25: 0.2 mg via ORAL

## 2015-01-25 MED ORDER — MECLIZINE HCL 12.5 MG PO TABS: 12.5000 mg | ORAL_TABLET | Freq: Three times a day (TID) | ORAL | Status: DC | PRN

## 2015-01-25 MED ORDER — LISINOPRIL 10 MG PO TABS
20.0000 mg | ORAL_TABLET | Freq: Every day | ORAL | Status: DC
  Administered 2015-01-25 – 2015-01-26 (×2): 20 mg via ORAL
  Filled 2015-01-25 (×2): qty 2

## 2015-01-25 MED ORDER — CLONIDINE HCL 0.2 MG PO TABS
ORAL_TABLET | ORAL | Status: AC
  Administered 2015-01-25: 0.2 mg via ORAL
  Filled 2015-01-25: qty 1
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
## 2015-01-25 NOTE — H&P (Signed)
[facility] History and Physical  Jeffrey Potts OMV:[id number] DOB: 10-25-1970 DOA: 01/25/2015  Referring physician: ED physician, Jeffrey Potts PCP: Jeffrey Ribas, MD   Chief Complaint: Generalized weakness and dizziness.  HPI: Jeffrey Potts is a 44 y.o. male with a history of hypertension, who presents to the emergency department with a complaint of generalized weakness and dizziness which started a couple days ago. He also reports a global headache and generalized swelling in both ankles. He denies focal unilateral weakness, visual changes, nausea, vomiting, chest pain, shortness of breath, abdominal pain, nausea, vomiting, diarrhea, or pain with urination. He has not taken any of his blood pressure medications including lisinopril, HCTZ, and clonidine for 7 or 8 months due to being lost to follow-up because of a lack of insurance. The pharmacy would not let him refill his medications because he had not seen a physician in over 2 years. He had been previously seen by the physicians at [facility] and by cardiologist, Dr. Rennis Potts.  On arrival to the ED, his blood pressure was 232/114. His heart rate was 85, he was afebrile, and oxygenating 98% on room air. CT of his head revealed no acute intracranial findings. His chest x-ray revealed findings suggestive of airways disease/bronchitis, but no focal airspace opacities. His EKG revealed normal sinus rhythm, heart rate 75 beats for minute, and diffuse lateral T-wave changes. His lab data are significant only for serum potassium of 3.4. His troponin I is negative. He is being admitted for further evaluation and management.     Review of Systems:  As above in history present illness. In addition, he does have an occasional cough. Otherwise review of systems is negative.  Past Medical History  Diagnosis Date  . Hypertension    Past Surgical History  Procedure Laterality Date  . Laceration repair      R thumb  . Laceration repair  L  inner thigh   Social History:  reports that he has never smoked. He has never used smokeless tobacco. He reports that he does not drink alcohol or use illicit drugs.  Allergies  Allergen Reactions  . Mushroom Extract Complex Shortness Of Breath  . Penicillins Other (See Comments)    Dry Throat    Family History  Problem Relation Age of Onset  . COPD Mother   . Cancer Other     Prior to Admission medications   Medication Sig Start Date End Date Taking? Authorizing Provider  cloNIDine (CATAPRES) 0.2 MG tablet Take 0.2 mg by mouth 2 (two) times daily.      Historical Provider, MD  ibuprofen (ADVIL,MOTRIN) 200 MG tablet Take 400 mg by mouth every 6 (six) hours as needed. Pain     Historical Provider, MD  lisinopril-hydrochlorothiazide (PRINZIDE,ZESTORETIC) 20-25 MG per tablet Take 1 tablet by mouth daily.    Historical Provider, MD  naproxen sodium (ALEVE) 220 MG tablet Take 220 mg by mouth as needed. For pain    Historical Provider, MD  predniSONE (DELTASONE) 20 MG tablet One pill two times a day Patient not taking: Reported on 01/25/2015 12/25/11   Bethann Berkshire, MD   Physical Exam: Filed Vitals:   01/25/15 1300 01/25/15 1310 01/25/15 1325 01/25/15 1330  BP: 154/92 160/99 161/93 170/94  Pulse: 81 91 96 101  Temp:      TempSrc:      Resp: 18 15 17 23   Height:      Weight:      SpO2: 95% 96% 97% 99%  Wt Readings from Last 3 Encounters:  01/25/15 117.935 kg (260 lb)  12/25/11 108.863 kg (240 lb)  05/06/11 104.327 kg (230 lb)    General:  Appears calm and comfortable; 44 year old Caucasian man in no acute distress. Eyes: PERRL, normal lids, irises & conjunctiva; conjunctiva are slightly injected, sclerae are clear. ENT: grossly normal hearing, lips & tongue; oropharynx reveals poor dentition, moist mucous membranes. Neck: no LAD, masses or thyromegaly Cardiovascular: S1, S2, with a 1 to 2/6 systolic murmur. Trace of pedal edema bilaterally right greater than  left. Telemetry: SR, no arrhythmias  Respiratory: CTA bilaterally, no w/r/r. Normal respiratory effort. Abdomen: Obese, positive bowel sounds, soft, nontender, nondistended. Skin: no rash or induration seen on limited exam Musculoskeletal: grossly normal tone BUE/BLE; no acute hot red joints. Psychiatric: grossly normal mood and affect, speech fluent and appropriate Neurologic: Alert and oriented 3. Cranial nerves II through XII are intact. No significant nystagmus. Strength is 5 over 5 throughout. Sensation is grossly intact. Gait not assessed.           Labs on Admission:  Basic Metabolic Panel:  Recent Labs Lab 01/25/15 1049  NA 136  K 3.4*  CL 103  CO2 24  GLUCOSE 106*  BUN 19  CREATININE 1.00  CALCIUM 8.9   Liver Function Tests:  Recent Labs Lab 01/25/15 1049  AST 34  ALT 62  ALKPHOS 115  BILITOT 0.7  PROT 7.7  ALBUMIN 4.2   No results for input(s): LIPASE, AMYLASE in the last 168 hours. No results for input(s): AMMONIA in the last 168 hours. CBC:  Recent Labs Lab 01/25/15 1049  WBC 9.3  HGB 15.8  HCT 45.6  MCV 85.4  PLT 250   Cardiac Enzymes:  Recent Labs Lab 01/25/15 1049  TROPONINI 0.03    BNP (last 3 results) No results for input(s): BNP in the last 8760 hours.  ProBNP (last 3 results) No results for input(s): PROBNP in the last 8760 hours.  CBG: No results for input(s): GLUCAP in the last 168 hours.  Radiological Exams on Admission: Dg Chest 2 View  01/25/2015   CLINICAL DATA:  Hypertension.  Headache, dizziness and weakness.  EXAM: CHEST  2 VIEW  COMPARISON:  None.  FINDINGS: Grossly unchanged borderline enlarged cardiac silhouette and mediastinal contours. There is mild diffuse slightly nodular thickening of the pulmonary interstitium. There is minimal pleural parenchymal thickening about the bilateral major fissures. No focal airspace opacities. No pleural effusion or pneumothorax. No evidence of edema. 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  IMPRESSION: Findings suggestive of airways disease / bronchitis. No focal airspace opacities to suggest pneumonia.   Electronically Signed   By: Simonne Come M.D.   On: 01/25/2015 12:12   Ct Head Wo Contrast  01/25/2015   CLINICAL DATA:  Headache.  Elevated blood pressure.  EXAM: CT HEAD WITHOUT CONTRAST  TECHNIQUE: Contiguous axial images were obtained from the base of the skull through the vertex without intravenous contrast.  COMPARISON:  Head CT -12/03/2013  FINDINGS: Gray-white differentiation is maintained. No CT evidence of acute large territory infarct. No intraparenchymal or extra-axial mass or hemorrhage. Normal size and configuration of the ventricles and basilar cisterns. No midline shift. Limited visualization the paranasal sinuses mastoid air cells is normal. No air-fluid levels. Regional soft tissues are normal. No displaced calvarial fracture.  IMPRESSION: Negative noncontrast  head CT.   Electronically Signed   By: Simonne Come M.D.   On: 01/25/2015 12:49    EKG: Independently reviewed.   Assessment/Plan Principal Problem:   Hypertensive urgency Active Problems:   Malignant hypertension   Vertigo   Generalized weakness   Abnormal EKG   1. Hypertensive urgency with a history of malignant hypertension. The patient had been taking lisinopril, HCTZ, and clonidine prior to running out approximately 8 months ago. He had been lost to follow-up because of a loss of insurance. His blood pressure was 232/114 on arrival to the ED. He was given clonidine and started on a Cardene drip. His blood pressure improved to 170/94. Will titrate off the Cardene drip. We'll restart him on the lisinopril/HCTZ and clonidine. Will order when necessary hydralazine.  2. Abnormal EKG. On admission, the patient's EKG revealed diffuse lateral  T-wave inversions. No old EKG is available for comparison. He does not endorse chest pain. His troponin I was negative in the ED. We will add an aspirin daily empirically. We will continue to cycle his troponin I. Will order 2-D echocardiogram for further evaluation. Of note, ED physician Jeffrey Potts discussed the patient with cardiologist on call, Dr. Mayford Knife. Apparently she recommended cycling cardiac enzymes and ordering an echocardiogram. 3. Dizziness and generalized weakness. His symptomatology is likely secondary to mild hypertensive encephalopathy. On exam, he has no neurological findings suggestive of an acute stroke. CT scan of the head was negative for acute abnormalities. We'll treat him supportively. Will order when necessary meclizine. Will order TSH for further evaluation.   Code Status: Full code DVT Prophylaxis: SCDs Family Communication: Discussed with patient; family not available Disposition Plan: Potential discharge in the next 48 hours, and/or when medically appropriate.  Time spent: One hour.  [facility] Pager [phone]

## 2015-01-25 NOTE — ED Notes (Signed)
Thursday BP 180/135 and just feel weak.

## 2015-01-25 NOTE — ED Notes (Signed)
Per Dr Sherrie Mustache, Cardene drip can be stopped.

## 2015-01-25 NOTE — ED Notes (Signed)
BP trending down. Cardene drip reduced to 5 mg/hr

## 2015-01-25 NOTE — ED Notes (Signed)
BP decreased to 150s systolic. Cardene reduced back to 7.5 mg/hr.

## 2015-01-25 NOTE — ED Notes (Signed)
Dr. Fisher in room with pt

## 2015-01-25 NOTE — ED Notes (Signed)
Report given to Matthew Wright, RN.  

## 2015-01-26 ENCOUNTER — Encounter (HOSPITAL_COMMUNITY): Payer: Self-pay | Admitting: Internal Medicine

## 2015-01-26 ENCOUNTER — Inpatient Hospital Stay (HOSPITAL_COMMUNITY): Payer: BLUE CROSS/BLUE SHIELD

## 2015-01-26 DIAGNOSIS — I10 Essential (primary) hypertension: Secondary | ICD-10-CM

## 2015-01-26 DIAGNOSIS — E669 Obesity, unspecified: Secondary | ICD-10-CM

## 2015-01-26 LAB — BASIC METABOLIC PANEL
ANION GAP: 7 (ref 5–15)
BUN: 18 mg/dL (ref 6–20)
CO2: 28 mmol/L (ref 22–32)
Calcium: 9.3 mg/dL (ref 8.9–10.3)
Chloride: 103 mmol/L (ref 101–111)
Creatinine, Ser: 1.2 mg/dL (ref 0.61–1.24)
GLUCOSE: 111 mg/dL — AB (ref 65–99)
Potassium: 3.6 mmol/L (ref 3.5–5.1)
SODIUM: 138 mmol/L (ref 135–145)

## 2015-01-26 LAB — CBC
HEMATOCRIT: 45.5 % (ref 39.0–52.0)
Hemoglobin: 15.5 g/dL (ref 13.0–17.0)
MCH: 29.5 pg (ref 26.0–34.0)
MCHC: 34.1 g/dL (ref 30.0–36.0)
MCV: 86.7 fL (ref 78.0–100.0)
PLATELETS: 242 10*3/uL (ref 150–400)
RBC: 5.25 MIL/uL (ref 4.22–5.81)
RDW: 13.5 % (ref 11.5–15.5)
WBC: 8.5 10*3/uL (ref 4.0–10.5)

## 2015-01-26 LAB — TROPONIN I: Troponin I: <0.03 ng/mL (ref <0.031)

## 2015-01-26 MED ORDER — LISINOPRIL 20 MG PO TABS: 20.0000 mg | ORAL_TABLET | Freq: Every day | ORAL | Status: DC

## 2015-01-26 MED ORDER — ASPIRIN 81 MG PO TBEC: 81.0000 mg | DELAYED_RELEASE_TABLET | Freq: Every day | ORAL | Status: AC

## 2015-01-26 MED ORDER — MECLIZINE HCL 12.5 MG PO TABS: 12.5000 mg | ORAL_TABLET | Freq: Three times a day (TID) | ORAL | Status: DC | PRN

## 2015-01-26 MED ORDER — CLONIDINE HCL 0.2 MG PO TABS: 0.2000 mg | ORAL_TABLET | Freq: Two times a day (BID) | ORAL | Status: DC

## 2015-01-26 MED ORDER — HYDROCHLOROTHIAZIDE 25 MG PO TABS: 25.0000 mg | ORAL_TABLET | Freq: Every day | ORAL | Status: DC

## 2015-01-26 NOTE — Progress Notes (Signed)
Pt VS stable.  IV removed with catheter intact.   Ambulated pt in hall.  Tolerated well.  Pt A&O. Discharge instructions reviewed and pt verbalized understanding.  Discharge prescriptions/instructions given to pt.  Discharged to home via private vehicle.

## 2015-01-26 NOTE — Discharge Summary (Signed)
Physician Discharge Summary  Jeffrey Potts NHA:[id number] DOB: 05/25/1971 DOA: 01/25/2015  PCP: Jeffrey Ribas, MD  Admit date: 01/25/2015 Discharge date: 01/26/2015  Time spent: Greater than 30 minutes  Recommendations for Outpatient Follow-up:  1. 2-D echocardiogram results pending at the time of discharge. Will need follow-up of the results.  Discharge Diagnoses:  1. Hypertensive urgency/malignant hypertension, secondary to medical noncompliance. 2. Generalized weakness and vertigo, thought to be secondary to malignant hypertension. Resolved. 3. Abnormal EKG with diffuse T-wave abnormalities. Troponin I was negative 3. 2-D echocardiogram pending. 4. Obesity.  Discharge Condition: Improved.  Diet recommendation: Heart healthy.  Filed Weights   01/25/15 0957 01/26/15 0616  Weight: 117.935 kg (260 lb) 124.24 kg (273 lb 14.4 oz)    History of present illness:  Jeffrey Potts is a 44 y.o. male with a history of hypertension, who presented to the emergency department with a complaint of generalized weakness and dizziness which started a couple days ago. He also reported a global headache and generalized swelling in both ankles. He denied focal unilateral weakness, visual changes, nausea, vomiting, chest pain, shortness of breath, abdominal pain, nausea, vomiting, diarrhea, or pain with urination. He has not taken any of his blood pressure medications including lisinopril, HCTZ, and clonidine for 7 or 8 months due to being lost to follow-up because of a lack of insurance. The pharmacy would not let him refill his medications because he had not seen a physician in over 2 years. He had been previously seen by the physicians at [facility] and by cardiologist, Jeffrey Potts.  On arrival to the ED, his blood pressure was 232/114. His heart rate was 85, he was afebrile, and oxygenating 98% on room air. CT of his head revealed no acute intracranial findings. His chest x-ray revealed findings  suggestive of airways disease/bronchitis, but no focal airspace opacities. His EKG revealed normal sinus rhythm, heart rate 75 beats for minute, and diffuse lateral T-wave changes. His lab data were significant only for serum potassium of 3.4. His troponin I was negative. He was admitted for further evaluation and management.  Hospital Course:  The patient was given clonidine and started on a Cardene drip in the emergency department. Subsequently, he was restarted on his chronic antihypertensives medications: Lisinopril, clonidine, and HCTZ. Cardene drip was discontinued. As needed meclizine was ordered for dizziness. He was supplemented with potassium chloride for borderline low serum potassium of 3.3. For further evaluation, a number of studies were ordered. His troponin I was negative 4. His TSH was within normal limits. His magnesium level was within normal limits. 2-D echocardiogram was pending at the time of discharge.  The patient's vertigo and generalized weakness resolved. He was given a prescription for clonidine, hydrochlorothiazide, and lisinopril, all of which can be obtained at [facility] for $4 or $10 depending on the number of pills. He preferred a 90 day supply which was granted. He was encouraged to follow-up at the [facility] Department for management of his hypertension long-term if he could not financially afford to follow-up with Jeffrey Potts. He voiced understanding.  He was informed that the 2-D echocardiogram results were pending at discharge and the results will be reviewed when available and discuss with him if there are any concerning findings.   Procedures:  01/26/2015-2-D echocardiogram: Pending  Consultations:  None  Discharge Exam: Filed Vitals:   01/26/15 1025  BP: 145/85  Pulse:   Temp:   Resp:    temperature 98.2. All 77. Respiratory rate 18.  Blood pressure 145/85. Oxygen saturation 97% on room air.  General: Obese 44 year old Caucasian man in  no acute distress. Cardiovascular: S1, S2, with a 1 to 2/6 systolic murmur. Respiratory: Clear to auscultation bilaterally. Neurologic: He is alert and oriented 3. Cranial nerves II through XII are intact.  Discharge Instructions   Discharge Instructions    Diet - low sodium heart healthy    Complete by:  As directed      Discharge instructions    Complete by:  As directed   Take medications as prescribed. Recommend follow-up after the [facility].     Increase activity slowly    Complete by:  As directed           Current Discharge Medication List    START taking these medications   Details  aspirin EC 81 MG EC tablet Take 1 tablet (81 mg total) by mouth daily.    hydrochlorothiazide (HYDRODIURIL) 25 MG tablet Take 1 tablet (25 mg total) by mouth daily. Qty: 90 tablet, Refills: 2    lisinopril (PRINIVIL,ZESTRIL) 20 MG tablet Take 1 tablet (20 mg total) by mouth daily. Qty: 90 tablet, Refills: 2    meclizine (ANTIVERT) 12.5 MG tablet Take 1 tablet (12.5 mg total) by mouth 3 (three) times daily as needed for dizziness. Qty: 30 tablet, Refills: 0      CONTINUE these medications which have CHANGED   Details  cloNIDine (CATAPRES) 0.2 MG tablet Take 1 tablet (0.2 mg total) by mouth 2 (two) times daily. Qty: 90 tablet, Refills: 2      CONTINUE these medications which have NOT CHANGED   Details  naproxen sodium (ALEVE) 220 MG tablet Take 220 mg by mouth as needed. For pain      STOP taking these medications     ibuprofen (ADVIL,MOTRIN) 200 MG tablet      lisinopril-hydrochlorothiazide (PRINZIDE,ZESTORETIC) 20-25 MG per tablet      predniSONE (DELTASONE) 20 MG tablet        Allergies  Allergen Reactions  . Mushroom Extract Complex Shortness Of Breath  . Penicillins Other (See Comments)    Dry Throat   Follow-up Information    Follow up with [facility]   Specialty:  Occupational Therapy   Why:  Call for follow-up  appointment for chronic management of your hypertension.   Contact information:   [address] [place] [address] [phone]        The results of significant diagnostics from this hospitalization (including imaging, microbiology, ancillary and laboratory) are listed below for reference.    Significant Diagnostic Studies: Dg Chest 2 View  01/25/2015   CLINICAL DATA:  Hypertension.  Headache, dizziness and weakness.  EXAM: CHEST  2 VIEW  COMPARISON:  None.  FINDINGS: Grossly unchanged borderline enlarged cardiac silhouette and mediastinal contours. There is mild diffuse slightly nodular thickening of the pulmonary interstitium. There is minimal pleural parenchymal thickening about the bilateral major fissures. No focal airspace opacities. No pleural effusion or pneumothorax. No evidence of edema. 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  IMPRESSION: Findings suggestive of airways disease / bronchitis. No focal airspace opacities to suggest pneumonia.   Electronically Signed   By: Simonne Come M.D.   On: 01/25/2015 12:12   Ct Head Wo Contrast  01/25/2015   CLINICAL DATA:  Headache.  Elevated blood pressure.  EXAM: CT HEAD WITHOUT CONTRAST  TECHNIQUE: Contiguous axial images were obtained from the base of the skull through the vertex without intravenous contrast.  COMPARISON:  Head CT -12/03/2013  FINDINGS: Gray-white differentiation is maintained. No CT evidence of acute large territory infarct. No intraparenchymal or extra-axial mass or hemorrhage. Normal size and configuration of the ventricles and basilar cisterns. No midline shift. Limited visualization the paranasal sinuses mastoid air cells is normal. No air-fluid levels. Regional soft tissues are normal. No displaced calvarial fracture.  IMPRESSION: Negative noncontrast  head CT.   Electronically Signed   By: Simonne Come M.D.   On: 01/25/2015 12:49    Microbiology: No results found for this or any previous visit (from the past 240 hour(s)).   Labs: Basic Metabolic Panel:  Recent Labs Lab 01/25/15 1049 01/26/15 0628  NA 136 138  K 3.4* 3.6  CL 103 103  CO2 24 28  GLUCOSE 106* 111*  BUN 19 18  CREATININE 1.00 1.20  CALCIUM 8.9 9.3  MG 2.0  --    Liver Function Tests:  Recent Labs Lab 01/25/15 1049  AST 34  ALT 62  ALKPHOS 115  BILITOT 0.7  PROT 7.7  ALBUMIN 4.2   No results for input(s): LIPASE, AMYLASE in the last 168 hours. No results for input(s): AMMONIA in the last 168 hours. CBC:  Recent Labs Lab 01/25/15 1049 01/26/15 0628  WBC 9.3 8.5  HGB 15.8 15.5  HCT 45.6 45.5  MCV 85.4 86.7  PLT 250 242   Cardiac Enzymes:  Recent Labs Lab 01/25/15 1049 01/25/15 2003 01/26/15 0200 01/26/15 0628  TROPONINI 0.03 <0.03 <0.03 <0.03   BNP: BNP (last 3 results) No results for input(s): BNP in the last 8760 hours.  ProBNP (last 3 results) No results for input(s): PROBNP in the last 8760 hours.  CBG: No results for input(s): GLUCAP in the last 168 hours.     Signed:  Lem Peary  [facility] 01/26/2015, 12:07 PM

## 2015-01-26 NOTE — Progress Notes (Signed)
Utilization review Completed Cataleya Cristina RN BSN   

## 2015-01-26 NOTE — Progress Notes (Signed)
Echocardiogram 2D Echocardiogram has been performed.  Jeffrey Potts 01/26/2015, 11:42 AM

## 2015-10-07 ENCOUNTER — Encounter (HOSPITAL_COMMUNITY): Payer: Self-pay | Admitting: Emergency Medicine

## 2015-10-07 ENCOUNTER — Emergency Department (HOSPITAL_COMMUNITY)
Admission: EM | Admit: 2015-10-07 | Discharge: 2015-10-07 | Disposition: A | Discharge status: Home / Self Care | Payer: BLUE CROSS/BLUE SHIELD | Attending: Emergency Medicine | Admitting: Emergency Medicine

## 2015-10-07 DIAGNOSIS — Z79899 Other long term (current) drug therapy: Secondary | ICD-10-CM | POA: Diagnosis not present

## 2015-10-07 DIAGNOSIS — Z7982 Long term (current) use of aspirin: Secondary | ICD-10-CM | POA: Insufficient documentation

## 2015-10-07 DIAGNOSIS — I10 Essential (primary) hypertension: Secondary | ICD-10-CM | POA: Diagnosis not present

## 2015-10-07 MED ORDER — CLONIDINE HCL 0.2 MG PO TABS: 0.2000 mg | ORAL_TABLET | Freq: Two times a day (BID) | ORAL | Status: DC

## 2015-10-07 MED ORDER — HYDROCODONE-ACETAMINOPHEN 5-325 MG PO TABS: 1.0000 | ORAL_TABLET | ORAL | Status: DC | PRN

## 2015-10-07 MED ORDER — CLONIDINE HCL 0.2 MG PO TABS
0.2000 mg | ORAL_TABLET | Freq: Once | ORAL | Status: AC
  Administered 2015-10-07: 0.2 mg via ORAL
  Filled 2015-10-07: qty 1

## 2015-10-07 NOTE — Discharge Instructions (Signed)
Hypertension Hypertension, commonly called high blood pressure, is when the force of blood pumping through your arteries is too strong. Your arteries are the blood vessels that carry blood from your heart throughout your body. A blood pressure reading consists of a higher number over a lower number, such as 110/72. The higher number (systolic) is the pressure inside your arteries when your heart pumps. The lower number (diastolic) is the pressure inside your arteries when your heart relaxes. Ideally you want your blood pressure below 120/80. Hypertension forces your heart to work harder to pump blood. Your arteries may become narrow or stiff. Having untreated or uncontrolled hypertension can cause heart attack, stroke, kidney disease, and other problems. RISK FACTORS Some risk factors for high blood pressure are controllable. Others are not.  Risk factors you cannot control include:   Race. You may be at higher risk if you are African American.  Age. Risk increases with age.  Gender. Men are at higher risk than women before age 45 years. After age 65, women are at higher risk than men. Risk factors you can control include:  Not getting enough exercise or physical activity.  Being overweight.  Getting too much fat, sugar, calories, or salt in your diet.  Drinking too much alcohol. SIGNS AND SYMPTOMS Hypertension does not usually cause signs or symptoms. Extremely high blood pressure (hypertensive crisis) may cause headache, anxiety, shortness of breath, and nosebleed. DIAGNOSIS To check if you have hypertension, your health care provider will measure your blood pressure while you are seated, with your arm held at the level of your heart. It should be measured at least twice using the same arm. Certain conditions can cause a difference in blood pressure between your right and left arms. A blood pressure reading that is higher than normal on one occasion does not mean that you need treatment. If  it is not clear whether you have high blood pressure, you may be asked to return on a different day to have your blood pressure checked again. Or, you may be asked to monitor your blood pressure at home for 1 or more weeks. TREATMENT Treating high blood pressure includes making lifestyle changes and possibly taking medicine. Living a healthy lifestyle can help lower high blood pressure. You may need to change some of your habits. Lifestyle changes may include:  Following the DASH diet. This diet is high in fruits, vegetables, and whole grains. It is low in salt, red meat, and added sugars.  Keep your sodium intake below 2,300 mg per day.  Getting at least 30-45 minutes of aerobic exercise at least 4 times per week.  Losing weight if necessary.  Not smoking.  Limiting alcoholic beverages.  Learning ways to reduce stress. Your health care provider may prescribe medicine if lifestyle changes are not enough to get your blood pressure under control, and if one of the following is true:  You are 18-59 years of age and your systolic blood pressure is above 140.  You are 60 years of age or older, and your systolic blood pressure is above 150.  Your diastolic blood pressure is above 90.  You have diabetes, and your systolic blood pressure is over 140 or your diastolic blood pressure is over 90.  You have kidney disease and your blood pressure is above 140/90.  You have heart disease and your blood pressure is above 140/90. Your personal target blood pressure may vary depending on your medical conditions, your age, and other factors. HOME CARE INSTRUCTIONS    Have your blood pressure rechecked as directed by your health care provider.   Take medicines only as directed by your health care provider. Follow the directions carefully. Blood pressure medicines must be taken as prescribed. The medicine does not work as well when you skip doses. Skipping doses also puts you at risk for  problems.  Do not smoke.   Monitor your blood pressure at home as directed by your health care provider. SEEK MEDICAL CARE IF:   You think you are having a reaction to medicines taken.  You have recurrent headaches or feel dizzy.  You have swelling in your ankles.  You have trouble with your vision. SEEK IMMEDIATE MEDICAL CARE IF:  You develop a severe headache or confusion.  You have unusual weakness, numbness, or feel faint.  You have severe chest or abdominal pain.  You vomit repeatedly.  You have trouble breathing. MAKE SURE YOU:   Understand these instructions.  Will watch your condition.  Will get help right away if you are not doing well or get worse.   This information is not intended to replace advice given to you by your health care provider. Make sure you discuss any questions you have with your health care provider.   Document Released: 07/19/2005 Document Revised: 12/03/2014 Document Reviewed: 05/11/2013 Elsevier Interactive Patient Education 2016 [employer]   Take your next dose of the clonidine tomorrow morning.  You may take the hydrocodone prescribed for pain relief.  This will make you drowsy - do not drive within 4 hours of taking this medication.

## 2015-10-07 NOTE — ED Provider Notes (Signed)
CSN: [id number]     Arrival date & time 10/07/15  1354 History   First MD Initiated Contact with Patient 10/07/15 1511     Chief Complaint  Patient presents with  . Hypertension     (Consider location/radiation/quality/duration/timing/severity/associated sxs/prior Treatment) The history is provided by the patient.   Jeffrey Potts is a 45 y.o. male presenting for evaluation of elevated blood pressure.  He is supposed to have rotator tear surgery in 2 days by an orthopedist in [place], reports has been medically cleared, assuming his blood pressure is under better control.  The past week it has been elevated as he checks it at a local pharmacy.  Reportss is numbers have been similar to the 165/115 here today. He denies focal weakness, numbness, dizziness, chest pain or sob.  He does have a mild frontal headache. He states he ran out of his clonidine about 2 months ago and also ran out of his hydrocodone for his shoulder pain last week.  He was seen by an outside clinic yesterday and was placed on amlodipine 10 mg but so far has not affected his blood pressure. He also takes lisinopril and hctz daily and has not missed doses.     Past Medical History  Diagnosis Date  . Hypertension   . Hypertensive urgency 01/25/2015   Past Surgical History  Procedure Laterality Date  . Laceration repair      R thumb  . Laceration repair  L inner thigh   Family History  Problem Relation Age of Onset  . COPD Mother   . Cancer Other    Social History  Substance Use Topics  . Smoking status: Never Smoker   . Smokeless tobacco: Never Used  . Alcohol Use: No    Review of Systems  Constitutional: Negative for fever.  HENT: Negative for congestion and sore throat.   Eyes: Negative.  Negative for visual disturbance.  Respiratory: Negative for chest tightness and shortness of breath.   Cardiovascular: Negative for chest pain and leg swelling.  Gastrointestinal: Negative for nausea and abdominal  pain.  Genitourinary: Negative.   Musculoskeletal: Positive for arthralgias. Negative for joint swelling and neck pain.  Skin: Negative.  Negative for rash and wound.  Neurological: Positive for headaches. Negative for dizziness, facial asymmetry, speech difficulty, weakness, light-headedness and numbness.  Psychiatric/Behavioral: Negative.       Allergies  Mushroom extract complex and Penicillins  Home Medications   Prior to Admission medications   Medication Sig Start Date End Date Taking? Authorizing Provider  aspirin EC 81 MG EC tablet Take 1 tablet (81 mg total) by mouth daily. 01/26/15   Elliot Cousinenise Fisher, MD  cloNIDine (CATAPRES) 0.2 MG tablet Take 1 tablet (0.2 mg total) by mouth 2 (two) times daily. 10/07/15   Burgess AmorJulie Jerick Khachatryan, PA-C  hydrochlorothiazide (HYDRODIURIL) 25 MG tablet Take 1 tablet (25 mg total) by mouth daily. 01/26/15   Elliot Cousinenise Fisher, MD  HYDROcodone-acetaminophen (NORCO/VICODIN) 5-325 MG tablet Take 1 tablet by mouth every 4 (four) hours as needed. 10/07/15   Burgess AmorJulie Jaymar Loeber, PA-C  lisinopril (PRINIVIL,ZESTRIL) 20 MG tablet Take 1 tablet (20 mg total) by mouth daily. 01/26/15   Elliot Cousinenise Fisher, MD  meclizine (ANTIVERT) 12.5 MG tablet Take 1 tablet (12.5 mg total) by mouth 3 (three) times daily as needed for dizziness. 01/26/15   Elliot Cousinenise Fisher, MD  naproxen sodium (ALEVE) 220 MG tablet Take 220 mg by mouth as needed. For pain    Historical Provider, MD   BP 141/85 mmHg  Pulse 104  Temp(Src) 98.5 F (36.9 C) (Oral)  Resp 18  Ht  (1.778 m)  Wt 124.739 kg  BMI 39.46 kg/m2  SpO2 98% Physical Exam  Constitutional: He appears well-developed and well-nourished.  HENT:  Head: Normocephalic and atraumatic.  Eyes: EOM are normal. Pupils are equal, round, and reactive to light.  Neck: Normal range of motion.  Cardiovascular: Normal rate, regular rhythm, normal heart sounds and intact distal pulses.   Pulmonary/Chest: Effort normal and breath sounds normal. No respiratory distress.  He has no wheezes.  Abdominal: Soft. Bowel sounds are normal. There is no tenderness.  Musculoskeletal: Normal range of motion.       Right shoulder: He exhibits tenderness.  Neurological: He is alert. He has normal strength. No cranial nerve deficit or sensory deficit. Coordination normal.  Skin: Skin is warm and dry.  Psychiatric: He has a normal mood and affect.  Nursing note and vitals reviewed.   ED Course  Procedures (including critical care time) Labs Review Labs Reviewed - No data to display  Imaging Review No results found. I have personally reviewed and evaluated these images and lab results as part of my medical decision-making.   EKG Interpretation None      MDM   Final diagnoses:  Essential hypertension    Pt endorses increased anxiety over concern he may not be able to have his surgery in 2 days due to elevated bp.  He was given his regular dose of clonidine with bp reducing to 141/85 while here.  He was prescribed additional clonidine.  Also prescribed hydrocodone as I suspect his shoulder pain may be contributing to the bp as well. No neuro deficits, headache gone at time of dc. No cp, no sob. Plan f/u with surgeon in 2 days as planned.    Burgess Amor, PA-C 10/07/15 2025  Raeford Razor, MD 10/08/15 [phone]

## 2015-10-07 NOTE — ED Notes (Signed)
Reports blood pressure has been elevated. 167/115 at home. C/o HA. Started new BP med yesterday.

## 2016-02-18 IMAGING — DX DG CHEST 2V
2 series · 2 of 2 positions shown · non-contrast
Comparison: None.

CLINICAL DATA: Hypertension.  Headache, dizziness and weakness.

EXAM:
CHEST  2 VIEW

[chest pa]
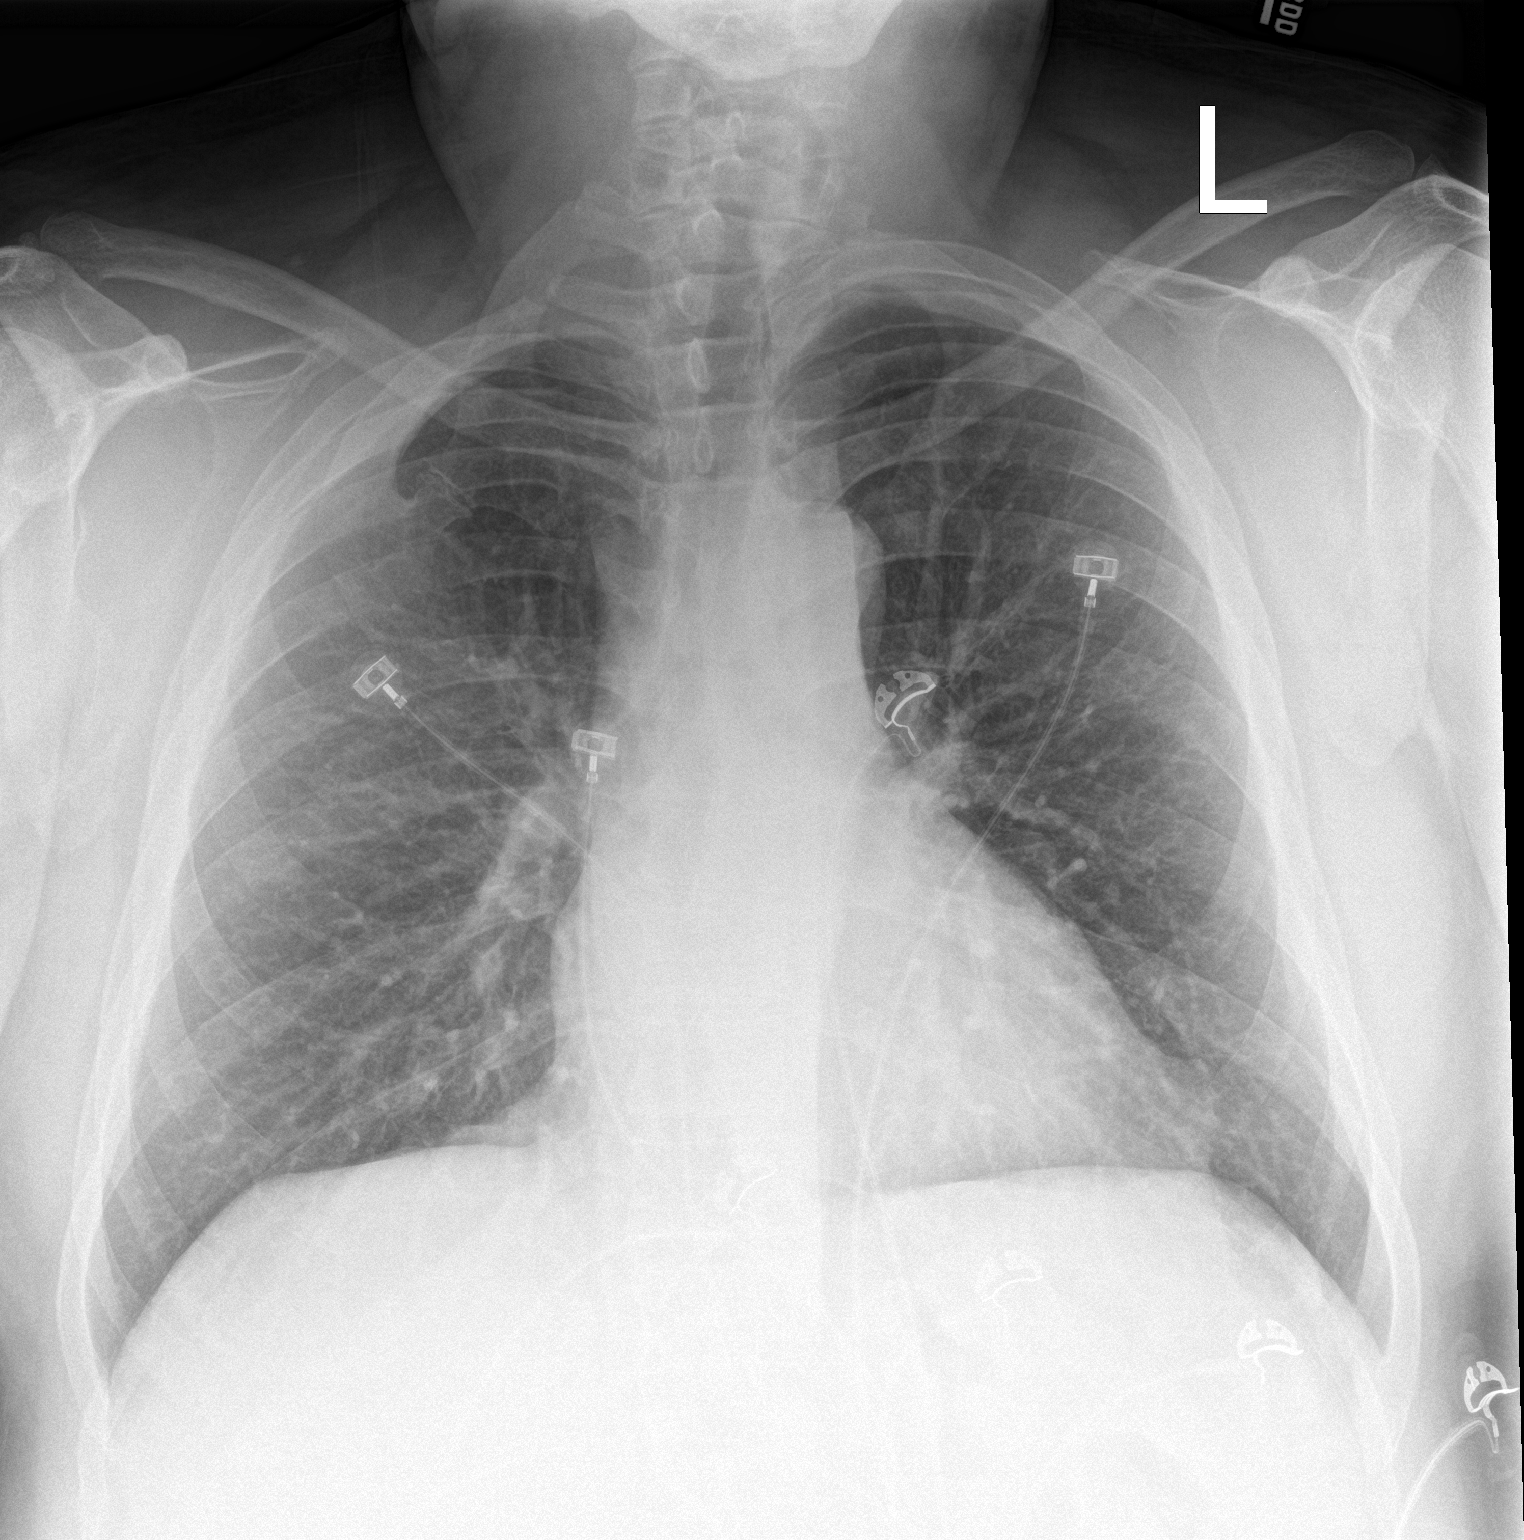

[chest lat]
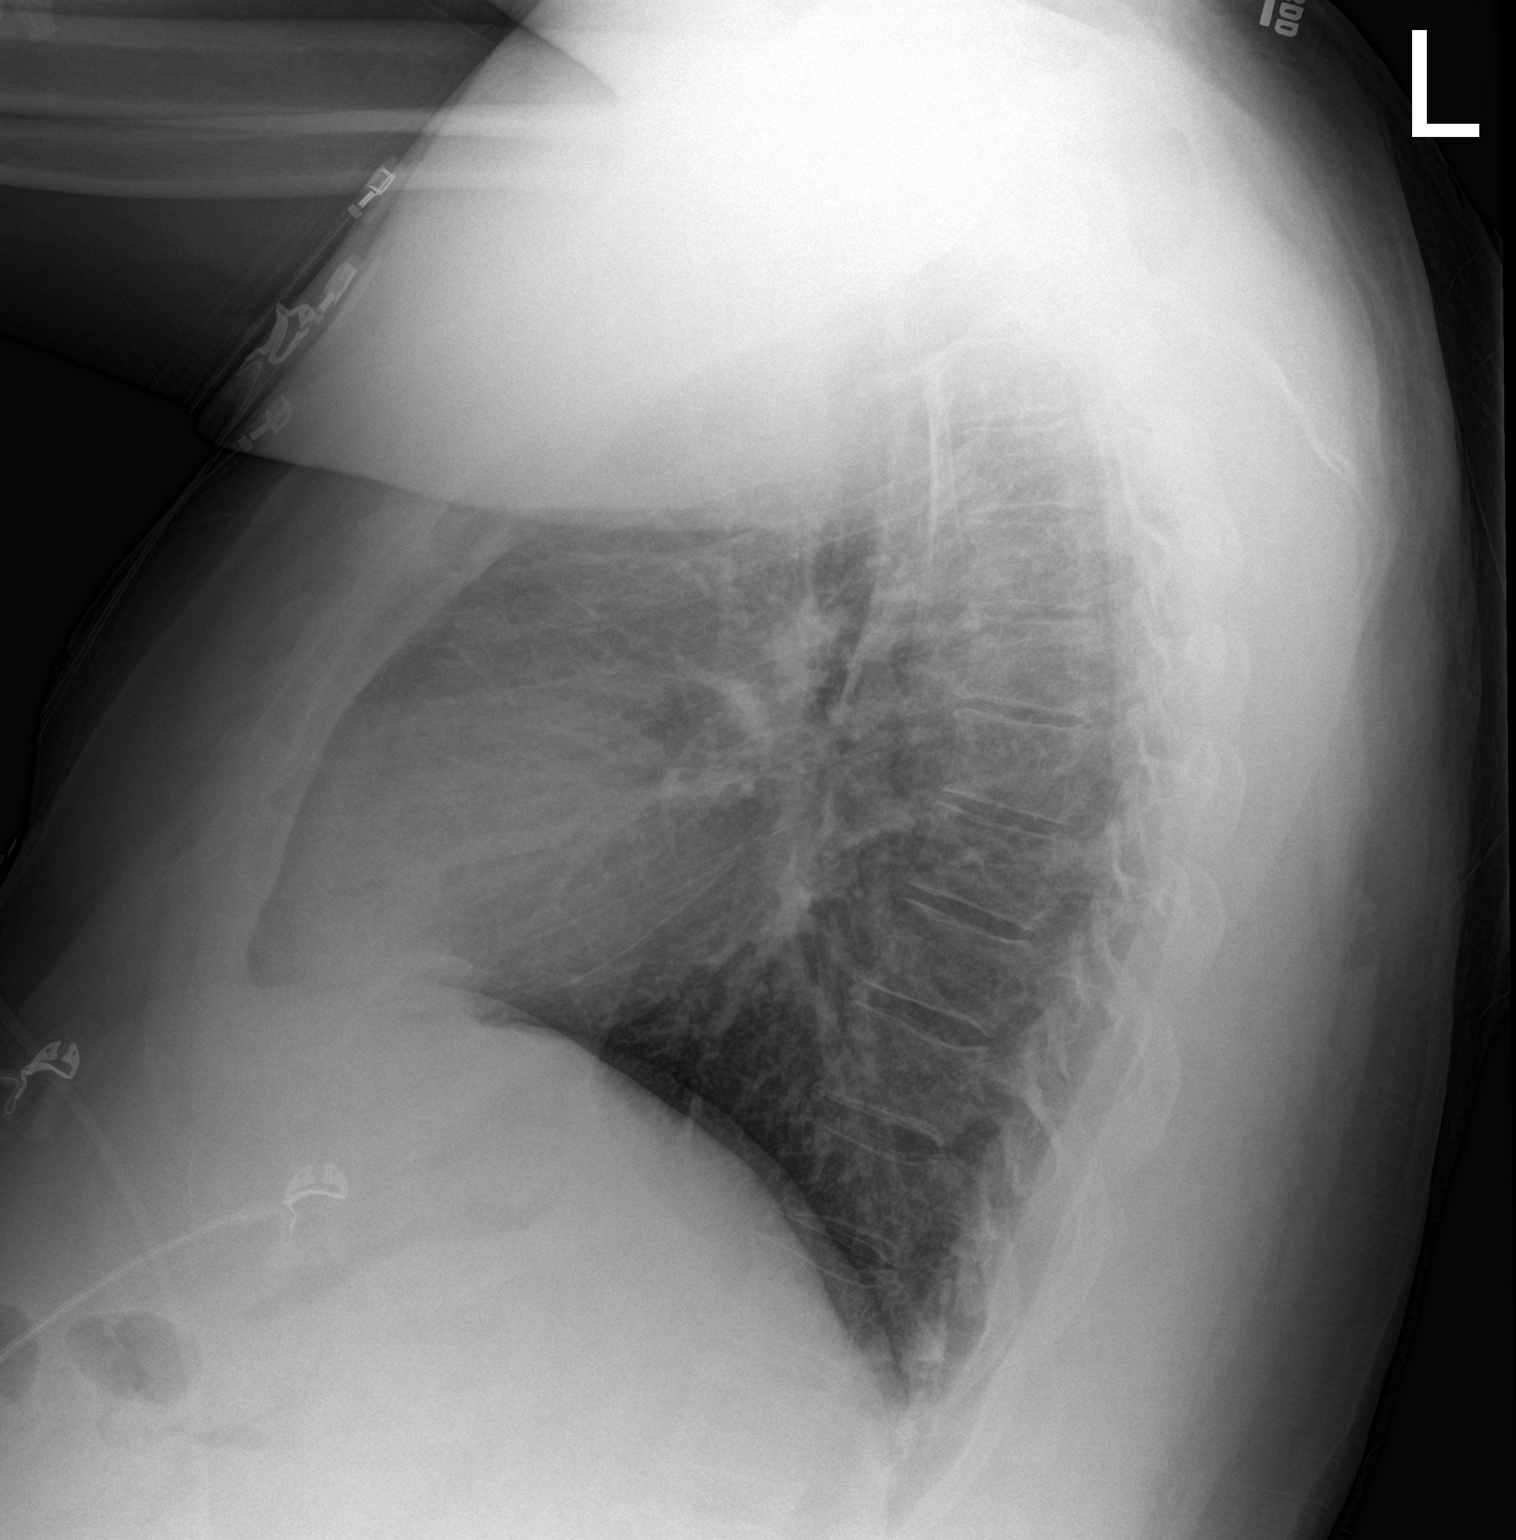

[2 of 2 positions shown; findings below may reference images not displayed]

FINDINGS: Grossly unchanged borderline enlarged cardiac silhouette and
mediastinal contours. There is mild diffuse slightly nodular
thickening of the pulmonary interstitium. There is minimal pleural
parenchymal thickening about the bilateral major fissures. No focal
airspace opacities. No pleural effusion or pneumothorax. No evidence
of edema. No acute osseus abnormalities with stable sequela of
congenital fusion deformity involving the anterior aspect of the
right second rib with apparent partial ankylosis with the adjacent
right first rib.
IMPRESSION: Findings suggestive of airways disease / bronchitis. No focal
airspace opacities to suggest pneumonia.

## 2016-02-18 IMAGING — CT CT HEAD W/O CM
1 series · 16 of 30 positions shown, 20 images · non-contrast
Comparison: Head CT -12/03/2013

CLINICAL DATA: Headache.  Elevated blood pressure.

EXAM:
CT HEAD WITHOUT CONTRAST
TECHNIQUE: Contiguous axial images were obtained from the base of the skull
through the vertex without intravenous contrast.

[Series 2: headseq 4.8 h37s · axial · 0.47mm/px · z∈[+1275,+1430]mm · 16 of 36 slices shown, 20 images]
[im 2/36  brain]
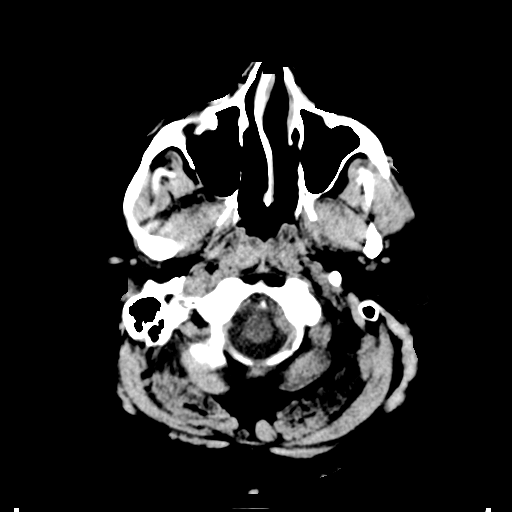
[im 2/36  bone]
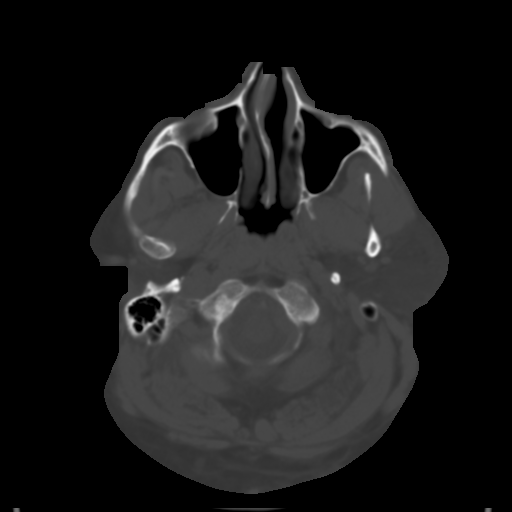
[im 4/36  brain]
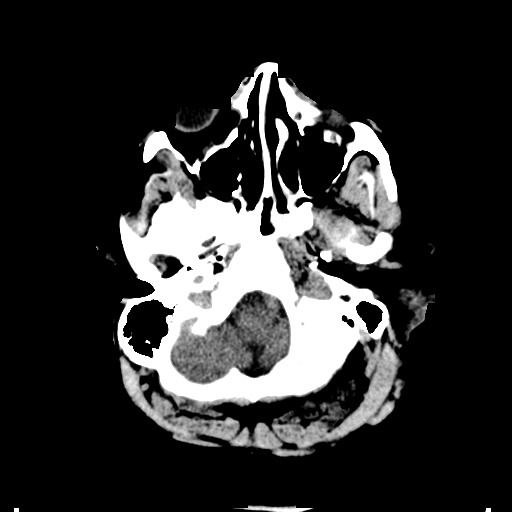
[im 7/36  brain]
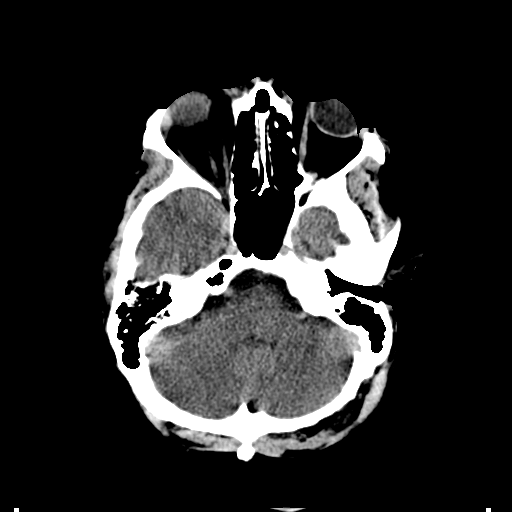
[im 9/36  brain]
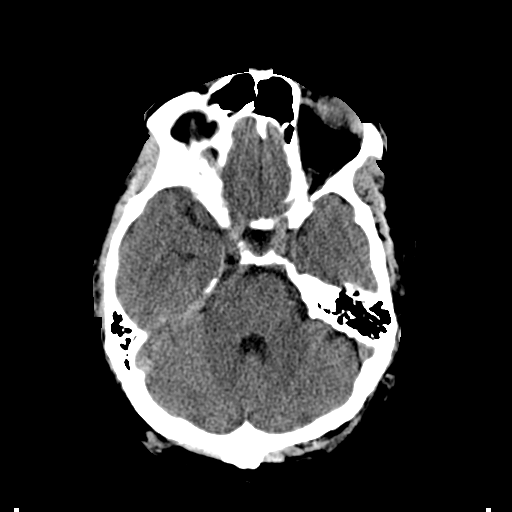
[im 10/36  brain]
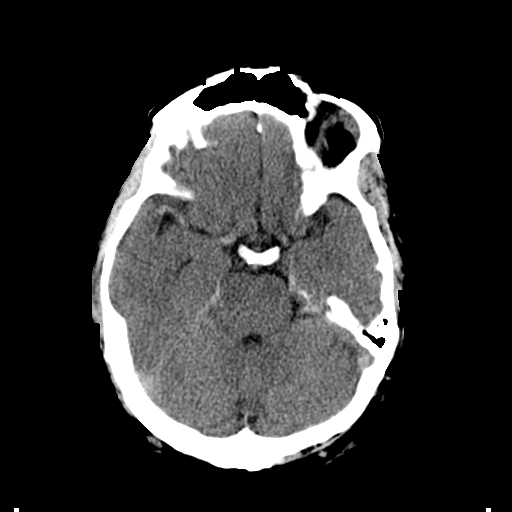
[im 10/36  bone]
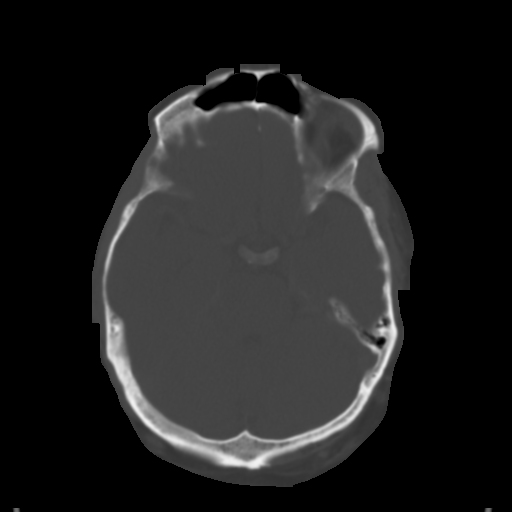
[im 13/36  brain]
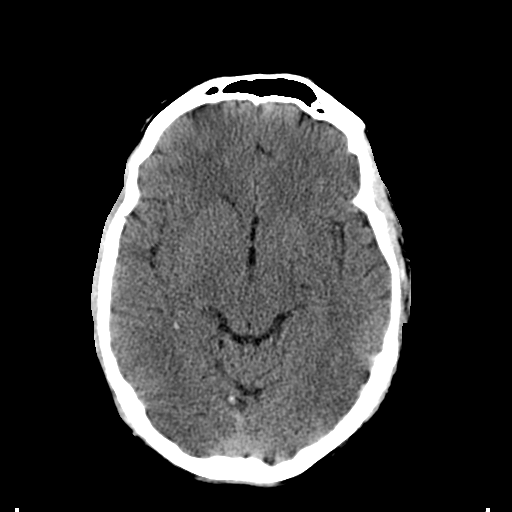
[im 15/36  brain]
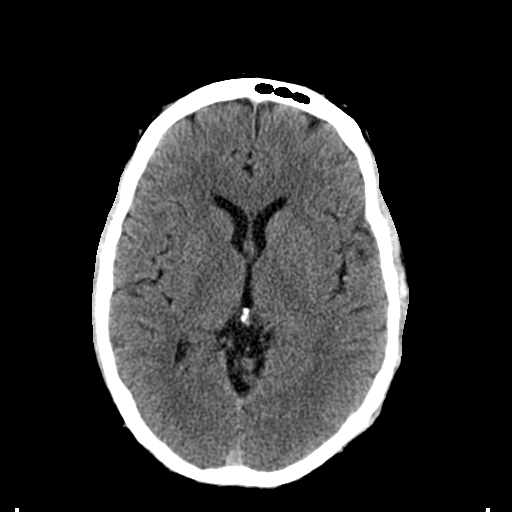
[im 17/36  brain]
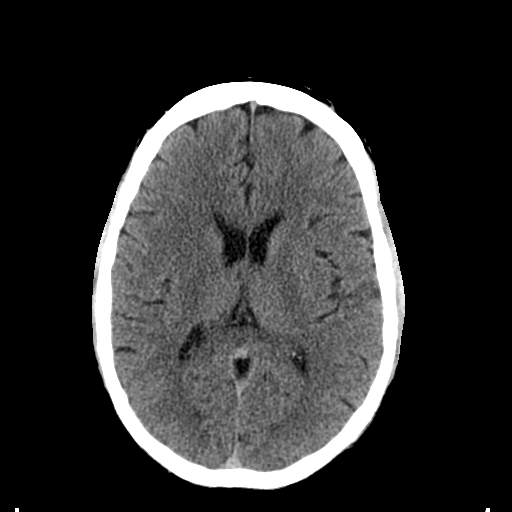
[im 19/36  brain]
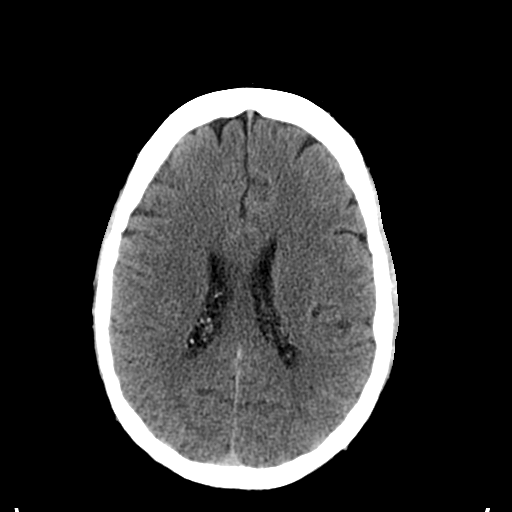
[im 19/36  bone]
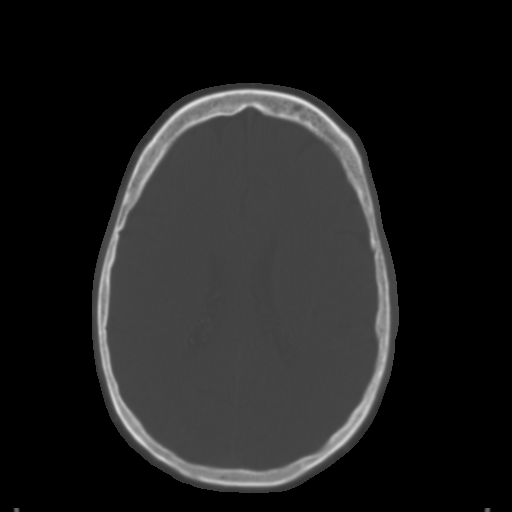
[im 21/36  brain]
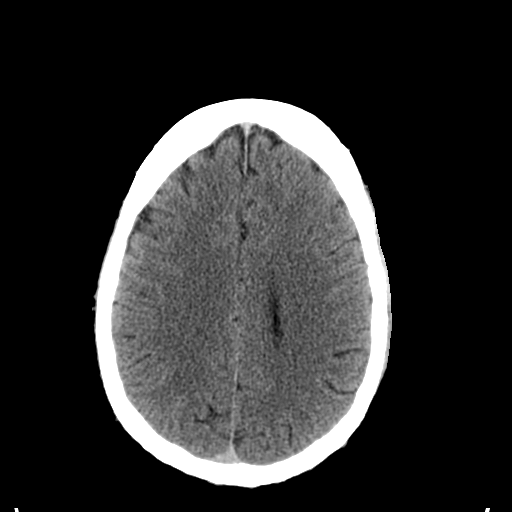
[im 23/36  brain]
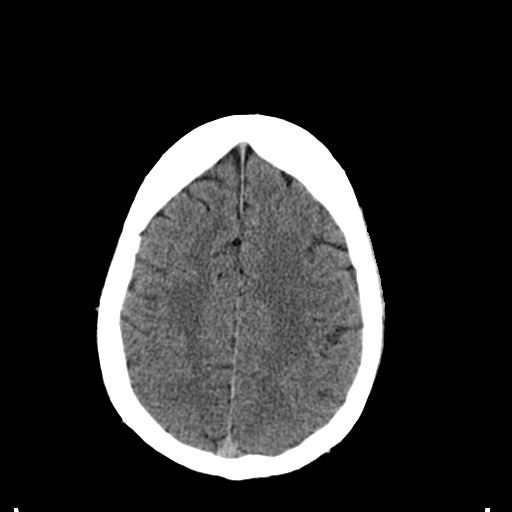
[im 26/36  brain]
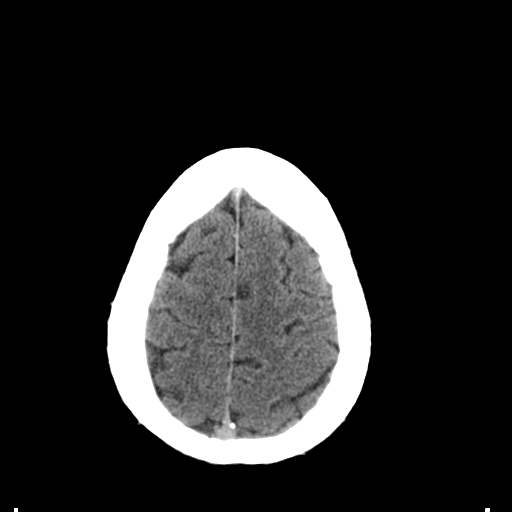
[im 27/36  brain]
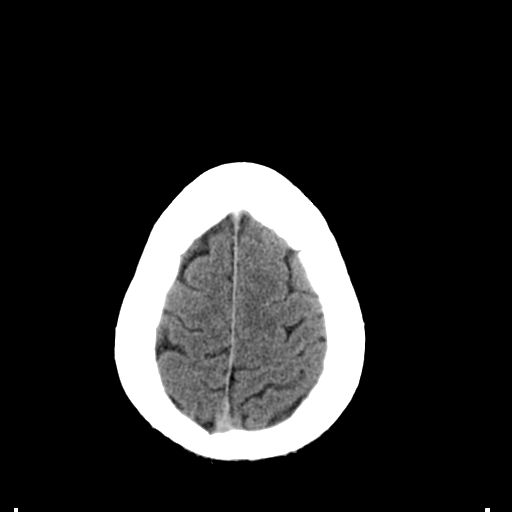
[im 27/36  bone]
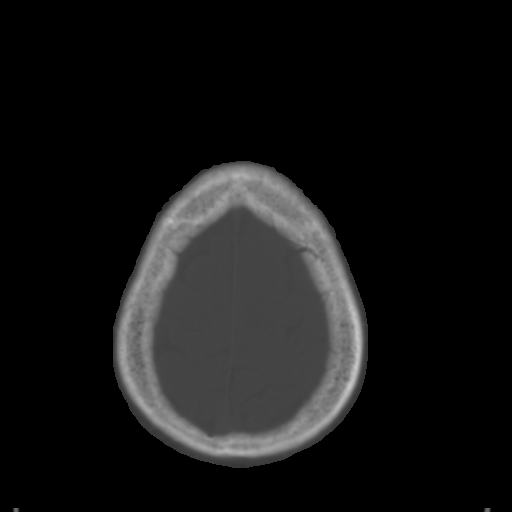
[im 29/36  brain]
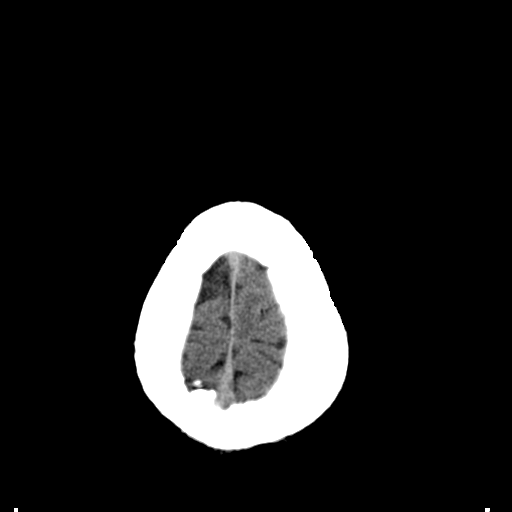
[im 32/36  brain]
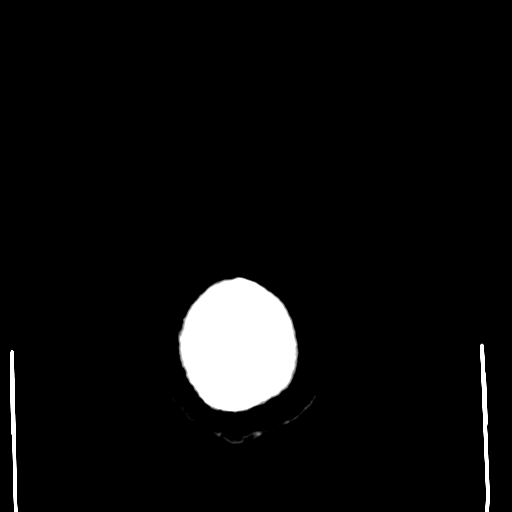
[im 34/36  brain]
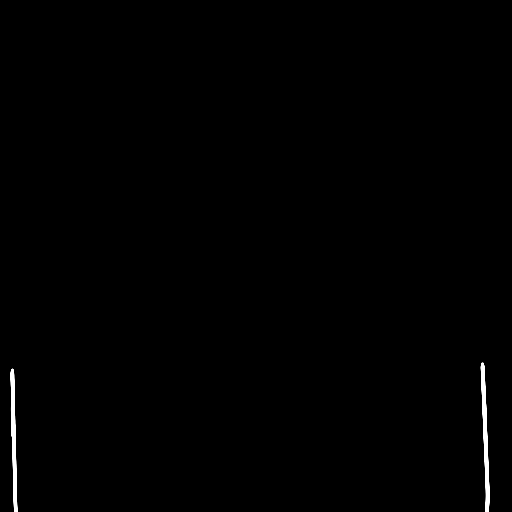

[16 of 30 positions shown; findings below may reference images not displayed]

FINDINGS: Gray-white differentiation is maintained. No CT evidence of acute
large territory infarct. No intraparenchymal or extra-axial mass or
hemorrhage. Normal size and configuration of the ventricles and
basilar cisterns. No midline shift. Limited visualization the
paranasal sinuses mastoid air cells is normal. No air-fluid levels.
Regional soft tissues are normal. No displaced calvarial fracture.
IMPRESSION: Negative noncontrast head CT.

## 2017-06-08 ENCOUNTER — Telehealth: Payer: Self-pay

## 2017-06-08 NOTE — Telephone Encounter (Signed)
[facility] follow up call: Attempted to call Mr Su HiltRoberts to update [place] [facility] [place]. No answer, message left on voicemail.  Francee NodalPatricia R Shatoria [place]ooksbury RN  [phone]

## 2018-11-03 ENCOUNTER — Emergency Department (HOSPITAL_COMMUNITY)
Admission: EM | Admit: 2018-11-03 | Discharge: 2018-11-04 | Disposition: A | Discharge status: Home / Self Care | Payer: BLUE CROSS/BLUE SHIELD | Attending: Emergency Medicine | Admitting: Emergency Medicine

## 2018-11-03 ENCOUNTER — Other Ambulatory Visit: Payer: Self-pay

## 2018-11-03 ENCOUNTER — Encounter (HOSPITAL_COMMUNITY): Payer: Self-pay | Admitting: *Deleted

## 2018-11-03 DIAGNOSIS — I10 Essential (primary) hypertension: Secondary | ICD-10-CM | POA: Diagnosis not present

## 2018-11-03 DIAGNOSIS — N4821 Abscess of corpus cavernosum and penis: Secondary | ICD-10-CM | POA: Diagnosis not present

## 2018-11-03 DIAGNOSIS — N4829 Other inflammatory disorders of penis: Secondary | ICD-10-CM | POA: Diagnosis not present

## 2018-11-03 DIAGNOSIS — Z79899 Other long term (current) drug therapy: Secondary | ICD-10-CM | POA: Diagnosis not present

## 2018-11-03 DIAGNOSIS — Z7982 Long term (current) use of aspirin: Secondary | ICD-10-CM | POA: Insufficient documentation

## 2018-11-03 MED ORDER — OXYCODONE-ACETAMINOPHEN 5-325 MG PO TABS
1.0000 | ORAL_TABLET | Freq: Once | ORAL | Status: AC
  Administered 2018-11-04: 1 via ORAL
  Filled 2018-11-03: qty 1

## 2018-11-03 MED ORDER — LIDOCAINE-EPINEPHRINE-TETRACAINE (LET) SOLUTION
3.0000 mL | Freq: Once | NASAL | Status: AC
  Administered 2018-11-04: 3 mL via TOPICAL
  Filled 2018-11-03: qty 3

## 2018-11-03 NOTE — ED Provider Notes (Signed)
[facility] EMERGENCY DEPARTMENT Provider Note   CSN: [id number] Arrival date & time: 11/03/18  2226    History   Chief Complaint Chief Complaint  Patient presents with   Groin Swelling    HPI Jeffrey Potts is a 48 y.o. male.     HPI  This is a 48 year old male with a history of hypertension who presents with penile swelling.  Patient reports he noted a "bump" on his penis on Tuesday.  He states that it is painful.  He noted over the last 3 to 4 hours significant increased swelling.  He denies any trauma.  He denies any new sexual partners or concerns for STDs.  He denies any history of STDs.  He states that it is painful to urinate and he notices that his urine "goes everywhere."  He has never had anything like this before.  He denies any fevers or back pain.  Currently he rates his pain at 9 out of 10.  He has not taken anything for his pain.  Past Medical History:  Diagnosis Date   Hypertension    Hypertensive urgency 01/25/2015    Patient Active Problem List   Diagnosis Date Noted   Hypertensive urgency 01/25/2015   Malignant hypertension 01/25/2015   Vertigo 01/25/2015   Generalized weakness 01/25/2015   Abnormal EKG 01/25/2015   Obesity 01/25/2015    Past Surgical History:  Procedure Laterality Date   LACERATION REPAIR     R thumb   LACERATION REPAIR  L inner thigh   SHOULDER SURGERY Right         Home Medications    Prior to Admission medications   Medication Sig Start Date End Date Taking? Authorizing Provider  aspirin EC 81 MG EC tablet Take 1 tablet (81 mg total) by mouth daily. 01/26/15   Elliot Cousin, MD  cephALEXin (KEFLEX) 500 MG capsule Take 1 capsule (500 mg total) by mouth 4 (four) times daily. 11/04/18   Lynell Kussman, Mayer Masker, MD  cloNIDine (CATAPRES) 0.2 MG tablet Take 1 tablet (0.2 mg total) by mouth 2 (two) times daily. 10/07/15   Burgess Amor, PA-C  hydrochlorothiazide (HYDRODIURIL) 25 MG tablet Take 1 tablet (25 mg total) by  mouth daily. 01/26/15   Elliot Cousin, MD  HYDROcodone-acetaminophen (NORCO/VICODIN) 5-325 MG tablet Take 1 tablet by mouth every 4 (four) hours as needed. 10/07/15   Burgess Amor, PA-C  lisinopril (PRINIVIL,ZESTRIL) 20 MG tablet Take 1 tablet (20 mg total) by mouth daily. 01/26/15   Elliot Cousin, MD  meclizine (ANTIVERT) 12.5 MG tablet Take 1 tablet (12.5 mg total) by mouth 3 (three) times daily as needed for dizziness. 01/26/15   Elliot Cousin, MD  naproxen sodium (ALEVE) 220 MG tablet Take 220 mg by mouth as needed. For pain    [provider]    Family History Family History  Problem Relation Age of Onset   COPD Mother    Cancer Other     Social History Social History   Tobacco Use   Smoking status: Never Smoker   Smokeless tobacco: Never Used  Substance Use Topics   Alcohol use: No   Drug use: No     Allergies   Mushroom extract complex and Penicillins   Review of Systems Review of Systems  Constitutional: Negative for fever.  Gastrointestinal: Negative for abdominal pain, nausea and vomiting.  Genitourinary: Positive for penile pain and penile swelling. Negative for discharge and scrotal swelling.  Musculoskeletal: Negative for back pain.  All other systems  reviewed and are negative.    Physical Exam Updated Vital Signs BP (!) 189/104 (BP Location: Right Arm)    Pulse (!) 110    Temp 98.2 F (36.8 C) (Oral)    Resp 18    Ht 1.778 m ( )    Wt 113.4 kg    SpO2 100%    BMI 35.87 kg/m   Physical Exam Vitals signs and nursing note reviewed.  Constitutional:      Appearance: He is well-developed.     Comments: Obese, nontoxic-appearing  HENT:     Head: Normocephalic and atraumatic.  Cardiovascular:     Rate and Rhythm: Normal rate and regular rhythm.     Pulses: Normal pulses.  Pulmonary:     Effort: Pulmonary effort is normal.  Abdominal:     Palpations: Abdomen is soft.     Tenderness: There is no abdominal tenderness. There is no  rebound.  Genitourinary:    Comments: Uncircumcised penis, difficult to visualize glans, foreskin is diffusely edematous most prominently over the ventral aspect of the penis, there is fluctuance and bruising noted, tenderness to palpation, no spontaneous drainage noted, normal testicular lie Skin:    General: Skin is warm and dry.  Neurological:     Mental Status: He is alert and oriented to person, place, and time.  Psychiatric:        Mood and Affect: Mood normal.      ED Treatments / Results  Labs (all labs ordered are listed, but only abnormal results are displayed) Labs Reviewed  CBG MONITORING, ED - Abnormal; Notable for the following components:      Result Value   Glucose-Capillary 110 (*)    All other components within normal limits    EKG None  Radiology No results found.  Procedures .Marland KitchenIncision and Drainage Date/Time: 11/04/2018 1:16 AM Performed by: Shon Baton, MD Authorized by: Shon Baton, MD   Consent:    Consent obtained:  Verbal   Consent given by:  Patient   Risks discussed:  Bleeding, incomplete drainage, pain and damage to other organs   Alternatives discussed:  No treatment Location:    Type:  Abscess   Size:  5x2 cm   Location:  Anogenital   Anogenital location: Penile foreskin. Pre-procedure details:    Skin preparation:  Chloraprep Anesthesia (see MAR for exact dosages):    Anesthesia method:  Topical application   Topical anesthetic:  LET Procedure type:    Complexity:  Simple Procedure details:    Needle aspiration: yes     Needle size:  18 G   Incision types:  Single straight   Incision depth:  Dermal   Scalpel blade:  11   Drainage:  Purulent and bloody   Drainage amount:  Copious   Packing materials:  None Post-procedure details:    Patient tolerance of procedure:  Tolerated well, no immediate complications   (including critical care time)  Medications Ordered in ED Medications  oxyCODONE-acetaminophen  (PERCOCET/ROXICET) 5-325 MG per tablet 1 tablet (1 tablet Oral Given 11/04/18 0019)  lidocaine-EPINEPHrine-tetracaine (LET) solution (3 mLs Topical Given 11/04/18 0019)     Initial Impression / Assessment and Plan / ED Course  I have reviewed the triage vital signs and the nursing notes.  Pertinent labs & imaging results that were available during my care of the patient were reviewed by me and considered in my medical decision making (see chart for details).        Patient presents  with what appears to be a abscess of the foreskin of his penis.  Denies any risk factors.  He is overall well-appearing.  He is afebrile.  Patient was given pain medication.  Initially an 18-gauge needle was introduced after topical anesthetic.  This resulted in copious purulent drainage.  Incision was extended with an 11 blade with continued copious purulent drainage.  Patient had relief of symptoms after drainage.  Given location, I elected not to place packing.  There is no evidence at this time of extensive cellulitis or Fournier's.  However, will place on Keflex.  Patient was given precautions regarding worsening symptoms or infection.  Recommend patient be evaluated in 2 days for recheck given how extensive and large the abscess was.  Patient stated understanding.  After history, exam, and medical workup I feel the patient has been appropriately medically screened and is safe for discharge home. Pertinent diagnoses were discussed with the patient. Patient was given return precautions.   Final Clinical Impressions(s) / ED Diagnoses   Final diagnoses:  Penile abscess    ED Discharge Orders         Ordered    cephALEXin (KEFLEX) 500 MG capsule  4 times daily,   Status:  Discontinued     11/04/18 0113    cephALEXin (KEFLEX) 500 MG capsule  4 times daily     11/04/18 0114           Atha Mcbain, Mayer Masker, MD 11/04/18 [phone]

## 2018-11-03 NOTE — ED Triage Notes (Signed)
Pt states he started off with a small lump to his penis x 3 days ago and states it has gotten bigger and more painful

## 2018-11-04 LAB — CBG MONITORING, ED: Glucose-Capillary: 110 mg/dL — ABNORMAL HIGH (ref 70–99)

## 2018-11-04 MED ORDER — CEPHALEXIN 500 MG PO CAPS: 500.0000 mg | ORAL_CAPSULE | Freq: Four times a day (QID) | ORAL | 0 refills | Status: DC

## 2018-11-04 NOTE — ED Notes (Signed)
Pt ambulatory to waiting room. Pt verbalized understanding of discharge instructions.   

## 2018-11-04 NOTE — Discharge Instructions (Addendum)
You were seen today and found to have an abscess of the shaft of your penis over the foreskin.  Start antibiotics as prescribed.  Apply ice if needed.  Monitor for signs of increasing redness, recurrent abscess, fevers, difficulty peeing.  You need to be reevaluated in 2 days for recheck.  You may return to the ED for this.

## 2018-11-05 ENCOUNTER — Emergency Department (HOSPITAL_COMMUNITY)
Admission: EM | Admit: 2018-11-05 | Discharge: 2018-11-05 | Disposition: A | Discharge status: Home / Self Care | Payer: BLUE CROSS/BLUE SHIELD | Attending: Emergency Medicine | Admitting: Emergency Medicine

## 2018-11-05 ENCOUNTER — Other Ambulatory Visit: Payer: Self-pay

## 2018-11-05 ENCOUNTER — Encounter (HOSPITAL_COMMUNITY): Payer: Self-pay | Admitting: *Deleted

## 2018-11-05 DIAGNOSIS — I10 Essential (primary) hypertension: Secondary | ICD-10-CM | POA: Diagnosis not present

## 2018-11-05 DIAGNOSIS — Z5189 Encounter for other specified aftercare: Secondary | ICD-10-CM

## 2018-11-05 DIAGNOSIS — N4821 Abscess of corpus cavernosum and penis: Secondary | ICD-10-CM | POA: Diagnosis not present

## 2018-11-05 DIAGNOSIS — Z4800 Encounter for change or removal of nonsurgical wound dressing: Secondary | ICD-10-CM | POA: Diagnosis not present

## 2018-11-05 DIAGNOSIS — Z79899 Other long term (current) drug therapy: Secondary | ICD-10-CM | POA: Diagnosis not present

## 2018-11-05 MED ORDER — CEFTRIAXONE SODIUM 1 G IJ SOLR
1.0000 g | Freq: Once | INTRAMUSCULAR | Status: AC
  Administered 2018-11-05: 1 g via INTRAMUSCULAR
  Filled 2018-11-05: qty 10

## 2018-11-05 NOTE — Discharge Instructions (Addendum)
Your blood pressure is elevated today at 169/102.  Please monitor this closely.  Your wound seems to be progressing nicely.  Please continue the warm tub soaks, as well as the Keflex as ordered.  Please return to the emergency department for recheck of your wound on April 8 or 9.  Please return sooner if any fever, excessive swelling of your wound, red streaking noted, worsening of your condition, problems, or concerns.

## 2018-11-05 NOTE — ED Triage Notes (Signed)
Was instructed to come here for follow up in 2 days.  Pt denies burning on urination.

## 2018-11-05 NOTE — ED Provider Notes (Signed)
[facility] EMERGENCY DEPARTMENT Provider Note   CSN: [id number] Arrival date & time: 11/05/18  1702    History   Chief Complaint Chief Complaint  Patient presents with  . Wound Check    HPI Jeffrey Potts is a 48 y.o. male.     Patient is a 48 year old male who presents to the emergency department for recheck of an abscess involving the foreskin of the penis.  The patient was seen in the emergency department on April 3 with a swelling/lump on his penis.  He was evaluated and found to have an abscess present.  Incision and drainage was carried out.  The patient was placed on Keflex.  The patient was asked to return today for evaluation.  The patient states that the size of the penis has gone down a lot, but is still swollen.  He is not having any pain of the shaft of the head of the penis.  He says the foreskin remains swollen.  He cannot retract the foreskin with a small distance.  He continues to have mild drainage from the incision and drainage area.  No fever, no nausea or vomiting reported.  He is currently using a warm tub soaks and Keflex.  The history is provided by the patient.  Wound Check  Pertinent negatives include no chest pain, no abdominal pain and no shortness of breath.    Past Medical History:  Diagnosis Date  . Hypertension   . Hypertensive urgency 01/25/2015    Patient Active Problem List   Diagnosis Date Noted  . Hypertensive urgency 01/25/2015  . Malignant hypertension 01/25/2015  . Vertigo 01/25/2015  . Generalized weakness 01/25/2015  . Abnormal EKG 01/25/2015  . Obesity 01/25/2015    Past Surgical History:  Procedure Laterality Date  . LACERATION REPAIR     R thumb  . LACERATION REPAIR  L inner thigh  . SHOULDER SURGERY Right         Home Medications    Prior to Admission medications   Medication Sig Start Date End Date Taking? Authorizing Provider  aspirin EC 81 MG EC tablet Take 1 tablet (81 mg total) by mouth daily. 01/26/15    Elliot Cousin, MD  cephALEXin (KEFLEX) 500 MG capsule Take 1 capsule (500 mg total) by mouth 4 (four) times daily. 11/04/18   Horton, Mayer Masker, MD  cloNIDine (CATAPRES) 0.2 MG tablet Take 1 tablet (0.2 mg total) by mouth 2 (two) times daily. 10/07/15   Burgess Amor, PA-C  hydrochlorothiazide (HYDRODIURIL) 25 MG tablet Take 1 tablet (25 mg total) by mouth daily. 01/26/15   Elliot Cousin, MD  HYDROcodone-acetaminophen (NORCO/VICODIN) 5-325 MG tablet Take 1 tablet by mouth every 4 (four) hours as needed. 10/07/15   Burgess Amor, PA-C  lisinopril (PRINIVIL,ZESTRIL) 20 MG tablet Take 1 tablet (20 mg total) by mouth daily. 01/26/15   Elliot Cousin, MD  meclizine (ANTIVERT) 12.5 MG tablet Take 1 tablet (12.5 mg total) by mouth 3 (three) times daily as needed for dizziness. 01/26/15   Elliot Cousin, MD  naproxen sodium (ALEVE) 220 MG tablet Take 220 mg by mouth as needed. For pain    [provider]    Family History Family History  Problem Relation Age of Onset  . COPD Mother   . Cancer Other     Social History Social History   Tobacco Use  . Smoking status: Never Smoker  . Smokeless tobacco: Never Used  Substance Use Topics  . Alcohol use: No  . Drug  use: No     Allergies   Mushroom extract complex and Penicillins   Review of Systems Review of Systems  Constitutional: Negative for activity change and fever.       All ROS Neg except as noted in HPI  HENT: Negative for nosebleeds.   Eyes: Negative for photophobia and discharge.  Respiratory: Negative for cough, shortness of breath and wheezing.   Cardiovascular: Negative for chest pain and palpitations.  Gastrointestinal: Negative for abdominal pain, blood in stool, nausea and vomiting.  Genitourinary: Positive for genital sores and penile swelling. Negative for discharge, dysuria, frequency, hematuria and testicular pain.  Musculoskeletal: Negative for arthralgias, back pain and neck pain.  Skin: Negative.   Neurological:  Negative for dizziness, seizures and speech difficulty.  Psychiatric/Behavioral: Negative for confusion and hallucinations.     Physical Exam Updated Vital Signs BP (!) 169/102 (BP Location: Left Arm)   Pulse 97   Temp 97.8 F (36.6 C) (Oral)   Resp 12   Ht 5\' 10"  (1.778 m)   Wt 113.4 kg   SpO2 95%   BMI 35.87 kg/m   Physical Exam Vitals signs and nursing note reviewed.  Constitutional:      Appearance: He is well-developed. He is not toxic-appearing.  HENT:     Head: Normocephalic.     Right Ear: Tympanic membrane and external ear normal.     Left Ear: Tympanic membrane and external ear normal.  Eyes:     General: Lids are normal.     Pupils: Pupils are equal, round, and reactive to light.  Neck:     Musculoskeletal: Normal range of motion and neck supple.     Vascular: No carotid bruit.  Cardiovascular:     Rate and Rhythm: Normal rate and regular rhythm.     Pulses: Normal pulses.     Heart sounds: Normal heart sounds.  Pulmonary:     Effort: No respiratory distress.     Breath sounds: Normal breath sounds.  Abdominal:     General: Bowel sounds are normal.     Palpations: Abdomen is soft.     Tenderness: There is no abdominal tenderness. There is no guarding.  Genitourinary:    Comments: There are no palpable nodes of the inguinal area.  The patient is uncircumcised.  The glans is visualized.  There is no discoloration or tenderness of the glans of the penis.  There is no drainage or discharge from the urethra.  There is increased swelling of the foreskin.  There are firm areas in the region of the abscess.  There is no active drainage from the incision and drainage area at this moment, there is some granulation tissue there.  There is no testicular tenderness or swelling.  There are no other lesions of the shaft of the penis and no tenderness of the shaft of the penis. Musculoskeletal: Normal range of motion.  Lymphadenopathy:     Head:     Right side of head: No  submandibular adenopathy.     Left side of head: No submandibular adenopathy.     Cervical: No cervical adenopathy.  Skin:    General: Skin is warm and dry.  Neurological:     Mental Status: He is alert and oriented to person, place, and time.     Cranial Nerves: No cranial nerve deficit.     Sensory: No sensory deficit.  Psychiatric:        Speech: Speech normal.      ED Treatments /  Results  Labs (all labs ordered are listed, but only abnormal results are displayed) Labs Reviewed - No data to display  EKG None  Radiology No results found.  Procedures Procedures (including critical care time)  Medications Ordered in ED Medications - No data to display   Initial Impression / Assessment and Plan / ED Course  I have reviewed the triage vital signs and the nursing notes.  Pertinent labs & imaging results that were available during my care of the patient were reviewed by me and considered in my medical decision making (see chart for details).          Final Clinical Impressions(s) / ED Diagnoses MDM  Blood pressure is elevated.  Of asked the patient have this rechecked soon.  Vital signs are otherwise within normal limits.  Pulse oximetry is 95% on room air.  Within normal limits by my interpretation.  The patient states that the swelling of the foreskin has improved significantly.  He continues to have swelling of the foreskin.  The glans of the penis is visible.  There is no drainage from the urethra.  There is no tenderness of the glans or discoloration.  The patient states that he still has some discomfort, but the discomfort has improved tremendously compared to 2 days ago.  I have asked the patient to continue the warm soaks.  To continue the antibiotics.  And to be rechecked on April 8 or 9.  I have asked the patient to return sooner if he has pain of the shaft of the penis or of the glans of the penis if he has difficulty with urinating, or signs of recurrent or  advancing infection.  The patient is in agreement with this plan.   Final diagnoses:  Visit for wound check  Penile abscess    ED Discharge Orders    None       Ivery Quale, Cordelia Poche 11/05/18 1806    Jacalyn Lefevre, MD 11/05/18 Barry Brunner

## 2018-11-27 DIAGNOSIS — R81 Glycosuria: Secondary | ICD-10-CM | POA: Diagnosis not present

## 2018-11-27 DIAGNOSIS — R109 Unspecified abdominal pain: Secondary | ICD-10-CM | POA: Diagnosis not present

## 2018-11-30 DIAGNOSIS — I10 Essential (primary) hypertension: Secondary | ICD-10-CM | POA: Diagnosis not present

## 2018-11-30 DIAGNOSIS — Z7689 Persons encountering health services in other specified circumstances: Secondary | ICD-10-CM | POA: Diagnosis not present

## 2018-11-30 DIAGNOSIS — Z1211 Encounter for screening for malignant neoplasm of colon: Secondary | ICD-10-CM | POA: Diagnosis not present

## 2019-01-15 ENCOUNTER — Other Ambulatory Visit: Payer: Self-pay

## 2019-01-15 ENCOUNTER — Ambulatory Visit: Payer: BC Managed Care – PPO | Admitting: Family Medicine

## 2019-01-15 ENCOUNTER — Encounter: Payer: Self-pay | Admitting: Family Medicine

## 2019-01-15 VITALS — BP 149/92 | HR 79 | Temp 96.9°F | Ht 70.0 in | Wt 279.8 lb

## 2019-01-15 DIAGNOSIS — Z9189 Other specified personal risk factors, not elsewhere classified: Secondary | ICD-10-CM | POA: Diagnosis not present

## 2019-01-15 DIAGNOSIS — I10 Essential (primary) hypertension: Secondary | ICD-10-CM | POA: Diagnosis not present

## 2019-01-15 DIAGNOSIS — Z1211 Encounter for screening for malignant neoplasm of colon: Secondary | ICD-10-CM

## 2019-01-15 DIAGNOSIS — R7309 Other abnormal glucose: Secondary | ICD-10-CM

## 2019-01-15 LAB — BAYER DCA HB A1C WAIVED: HB A1C (BAYER DCA - WAIVED): 5.2 % (ref <7.0)

## 2019-01-15 NOTE — Progress Notes (Signed)
BP (!) 149/92   Pulse 79   Temp (!) 96.9 F (36.1 C) (Oral)   Ht 5' 10"  (1.778 m)   Wt 279 lb 12.8 oz (126.9 kg)   BMI 40.15 kg/m    Subjective:    Patient ID: Jeffrey Potts, male    DOB: 03-11-1971, 48 y.o.   MRN: [id number]  HPI: Jeffrey Potts is a 48 y.o. male presenting on 01/15/2019 for New Patient (Initial Visit) (est care) and Diabetes (Patient states he was just dx with pre diabetes from [place])   HPI Hypertension Patient is currently on lisinopril and hydrochlorothiazide, and their blood pressure today is 149/92. Patient denies any lightheadedness or dizziness. Patient denies headaches, blurred vision, chest pains, shortness of breath, or weakness. Denies any side effects from medication and is content with current medication.  He had just had his lisinopril increased to 40 mg a few days ago.  Elevated glucose Patient had an elevated glucose on his last blood work done through 1 of his specialists and he is coming in here to follow-up with it because they told him he likely had prediabetes and we will do the follow-up with A1c.  Denies any blood sugar problems that he knows of from before.  Penile sore Patient sees Dr. Alyson Ingles for a sore on the foreskin of his penis and they are doing treatment for it.  He will continue to see them.  Relevant past medical, surgical, family and social history reviewed and updated as indicated. Interim medical history since our last visit reviewed. Allergies and medications reviewed and updated.  Review of Systems  Constitutional: Negative for chills and fever.  Eyes: Negative for visual disturbance.  Respiratory: Negative for cough, shortness of breath and wheezing.   Cardiovascular: Negative for chest pain, palpitations and leg swelling.  Gastrointestinal: Negative for abdominal pain, blood in stool, constipation and diarrhea.  Musculoskeletal: Negative for back pain, gait problem and myalgias.  Skin: Negative for rash.   Neurological: Negative for dizziness, weakness, numbness and headaches.  Psychiatric/Behavioral: Negative for suicidal ideas.  All other systems reviewed and are negative.   Per HPI unless specifically indicated above  Social History   Socioeconomic History  . Marital status: Single    Spouse name: Not on file  . Number of children: Not on file  . Years of education: Not on file  . Highest education level: Not on file  Occupational History  . Not on file  Social Needs  . Financial resource strain: Not on file  . Food insecurity    Worry: Not on file    Inability: Not on file  . Transportation needs    Medical: Not on file    Non-medical: Not on file  Tobacco Use  . Smoking status: Never Smoker  . Smokeless tobacco: Never Used  Substance and Sexual Activity  . Alcohol use: No  . Drug use: No  . Sexual activity: Yes    Birth control/protection: None  Lifestyle  . Physical activity    Days per week: Not on file    Minutes per session: Not on file  . Stress: Not on file  Relationships  . Social Herbalist on phone: Not on file    Gets together: Not on file    Attends religious service: Not on file    Active member of club or organization: Not on file    Attends meetings of clubs or organizations: Not on file    Relationship  status: Not on file  . Intimate partner violence    Fear of current or ex partner: Not on file    Emotionally abused: Not on file    Physically abused: Not on file    Forced sexual activity: Not on file  Other Topics Concern  . Not on file  Social History Narrative  . Not on file    Past Surgical History:  Procedure Laterality Date  . LACERATION REPAIR     R thumb  . LACERATION REPAIR  L inner thigh  . SHOULDER SURGERY Right     Family History  Problem Relation Age of Onset  . COPD Mother   . Cancer Other   . Cancer Father        colon  . Stroke Father     Allergies as of 01/15/2019      Reactions   Mushroom Extract  Complex Shortness Of Breath   Penicillins Other (See Comments)   Dry Throat      Medication List       Accurate as of January 15, 2019  9:56 AM. If you have any questions, ask your nurse or doctor.        STOP taking these medications   cephALEXin 500 MG capsule Commonly known as: KEFLEX Stopped by: Fransisca Kaufmann Dettinger, MD   cloNIDine 0.2 MG tablet Commonly known as: CATAPRES Stopped by: Worthy Rancher, MD   HYDROcodone-acetaminophen 5-325 MG tablet Commonly known as: NORCO/VICODIN Stopped by: Fransisca Kaufmann Dettinger, MD   meclizine 12.5 MG tablet Commonly known as: ANTIVERT Stopped by: Worthy Rancher, MD     TAKE these medications   Aleve 220 MG tablet Generic drug: naproxen sodium Take 220 mg by mouth as needed. For pain   aspirin 81 MG EC tablet Take 1 tablet (81 mg total) by mouth daily.   clotrimazole-betamethasone cream Commonly known as: LOTRISONE APPLY ONE GRAM OF CREAM TOPICALLY TWICE DAILY FOR 21 DAYS   hydrochlorothiazide 25 MG tablet Commonly known as: HYDRODIURIL Take 1 tablet (25 mg total) by mouth daily.   lisinopril 40 MG tablet Commonly known as: ZESTRIL Take 1 tablet by mouth daily. What changed: Another medication with the same name was removed. Continue taking this medication, and follow the directions you see here. Changed by: Fransisca Kaufmann Dettinger, MD          Objective:    BP (!) 149/92   Pulse 79   Temp (!) 96.9 F (36.1 C) (Oral)   Ht 5' 10"  (1.778 m)   Wt 279 lb 12.8 oz (126.9 kg)   BMI 40.15 kg/m   Wt Readings from Last 3 Encounters:  01/15/19 279 lb 12.8 oz (126.9 kg)  11/05/18 250 lb (113.4 kg)  11/03/18 250 lb (113.4 kg)    Physical Exam Vitals signs and nursing note reviewed.  Constitutional:      General: He is not in acute distress.    Appearance: He is well-developed. He is not diaphoretic.  Eyes:     General: No scleral icterus.       Right eye: No discharge.     Conjunctiva/sclera: Conjunctivae normal.      Pupils: Pupils are equal, round, and reactive to light.  Neck:     Musculoskeletal: Neck supple.     Thyroid: No thyromegaly.  Cardiovascular:     Rate and Rhythm: Normal rate and regular rhythm.     Heart sounds: Normal heart sounds. No murmur.  Pulmonary:  Effort: Pulmonary effort is normal. No respiratory distress.     Breath sounds: Normal breath sounds. No wheezing.  Musculoskeletal: Normal range of motion.  Lymphadenopathy:     Cervical: No cervical adenopathy.  Skin:    General: Skin is warm and dry.     Findings: No rash.  Neurological:     Mental Status: He is alert and oriented to person, place, and time.     Coordination: Coordination normal.  Psychiatric:        Behavior: Behavior normal.     Results for orders placed or performed during the hospital encounter of 11/03/18  CBG monitoring, ED  Result Value Ref Range   Glucose-Capillary 110 (H) 70 - 99 mg/dL      Assessment & Plan:   Problem List Items Addressed This Visit      Cardiovascular and Mediastinum   Hypertension   Relevant Medications   lisinopril (ZESTRIL) 40 MG tablet   Other Relevant Orders   Amb ref to Medical Nutrition Therapy-MNT   CBC with Differential/Platelet (Completed)   CMP14+EGFR (Completed)   Bayer DCA Hb A1c Waived (Completed)    Other Visit Diagnoses    Elevated glucose    -  Primary   Relevant Orders   Amb ref to Medical Nutrition Therapy-MNT   Bayer DCA Hb A1c Waived (Completed)   Colon cancer screening       Relevant Orders   Ambulatory referral to Gastroenterology   [place] cardiac risk 10-20% in next 10 years       Relevant Orders   Ambulatory referral to Cardiology   Amb ref to Medical Nutrition Therapy-MNT   Lipid panel (Completed)      For his blood pressure since his lisinopril was just increased a few days ago we will not change it but will give more time to see if it works.  Because of glucose and blood pressure patient wants a referral to nutrition   Will check A1c for elevated glucose on last blood work with Dr. Alyson Ingles. Follow up plan: Return in about 4 weeks (around 02/12/2019), or if symptoms worsen or fail to improve, for htn recheck.  Caryl Pina, MD [facility] 01/15/2019, 9:56 AM

## 2019-01-16 LAB — CBC WITH DIFFERENTIAL/PLATELET
Basophils Absolute: 0 10*3/uL (ref 0.0–0.2)
Basos: 1 %
EOS (ABSOLUTE): 0.3 10*3/uL (ref 0.0–0.4)
Eos: 4 %
Hematocrit: 46.5 % (ref 37.5–51.0)
Hemoglobin: 15.9 g/dL (ref 13.0–17.7)
Immature Grans (Abs): 0 10*3/uL (ref 0.0–0.1)
Immature Granulocytes: 0 %
Lymphocytes Absolute: 1.9 10*3/uL (ref 0.7–3.1)
Lymphs: 27 %
MCH: 28.9 pg (ref 26.6–33.0)
MCHC: 34.2 g/dL (ref 31.5–35.7)
MCV: 84 fL (ref 79–97)
Monocytes Absolute: 0.5 10*3/uL (ref 0.1–0.9)
Monocytes: 7 %
Neutrophils Absolute: 4.2 10*3/uL (ref 1.4–7.0)
Neutrophils: 61 %
Platelets: 260 10*3/uL (ref 150–450)
RBC: 5.51 x10E6/uL (ref 4.14–5.80)
RDW: 13.3 % (ref 11.6–15.4)
WBC: 6.9 10*3/uL (ref 3.4–10.8)

## 2019-01-16 LAB — CMP14+EGFR
ALT: 42 IU/L (ref 0–44)
AST: 22 IU/L (ref 0–40)
Albumin/Globulin Ratio: 1.5 (ref 1.2–2.2)
Albumin: 4.4 g/dL (ref 4.0–5.0)
Alkaline Phosphatase: 83 IU/L (ref 39–117)
BUN/Creatinine Ratio: 14 (ref 9–20)
BUN: 16 mg/dL (ref 6–24)
Bilirubin Total: 0.4 mg/dL (ref 0.0–1.2)
CO2: 21 mmol/L (ref 20–29)
Calcium: 10 mg/dL (ref 8.7–10.2)
Chloride: 101 mmol/L (ref 96–106)
Creatinine, Ser: 1.11 mg/dL (ref 0.76–1.27)
GFR calc Af Amer: 89 mL/min/{1.73_m2} (ref >59)
GFR calc non Af Amer: 77 mL/min/{1.73_m2} (ref >59)
Globulin, Total: 2.9 g/dL (ref 1.5–4.5)
Glucose: 117 mg/dL — ABNORMAL HIGH (ref 65–99)
Potassium: 4.2 mmol/L (ref 3.5–5.2)
Sodium: 138 mmol/L (ref 134–144)
Total Protein: 7.3 g/dL (ref 6.0–8.5)

## 2019-01-16 LAB — LIPID PANEL
Chol/HDL Ratio: 4 ratio (ref 0.0–5.0)
Cholesterol, Total: 170 mg/dL (ref 100–199)
HDL: 42 mg/dL (ref >39)
LDL Calculated: 110 mg/dL — ABNORMAL HIGH (ref 0–99)
Triglycerides: 90 mg/dL (ref 0–149)
VLDL Cholesterol Cal: 18 mg/dL (ref 5–40)

## 2019-01-17 ENCOUNTER — Ambulatory Visit: Payer: BLUE CROSS/BLUE SHIELD | Admitting: Urology

## 2019-01-17 ENCOUNTER — Encounter: Payer: Self-pay | Admitting: Gastroenterology

## 2019-02-14 ENCOUNTER — Ambulatory Visit: Payer: BC Managed Care – PPO | Admitting: Family Medicine

## 2019-02-28 ENCOUNTER — Ambulatory Visit: Payer: BLUE CROSS/BLUE SHIELD | Admitting: Cardiovascular Disease

## 2019-03-01 ENCOUNTER — Encounter: Payer: Self-pay | Admitting: Cardiovascular Disease

## 2019-03-13 ENCOUNTER — Telehealth: Payer: Self-pay | Admitting: Family Medicine

## 2019-03-13 NOTE — Telephone Encounter (Signed)
Spoke with pt regarding appt Pt declined appt at this time due to working out of town Pt will call back to schedule

## 2019-03-15 ENCOUNTER — Ambulatory Visit: Payer: BC Managed Care – PPO | Admitting: Family Medicine

## 2019-04-02 ENCOUNTER — Other Ambulatory Visit: Payer: Self-pay

## 2019-04-02 ENCOUNTER — Ambulatory Visit (INDEPENDENT_AMBULATORY_CARE_PROVIDER_SITE_OTHER): Payer: BC Managed Care – PPO | Admitting: *Deleted

## 2019-04-02 DIAGNOSIS — Z1211 Encounter for screening for malignant neoplasm of colon: Secondary | ICD-10-CM

## 2019-04-02 MED ORDER — PEG 3350-KCL-NA BICARB-NACL 420 G PO SOLR: 4000.0000 mL | Freq: Once | ORAL | 0 refills | Status: AC

## 2019-04-02 NOTE — Progress Notes (Signed)
Gastroenterology Pre-Procedure Review  Request Date: 04/02/2019 Requesting Physician: Dr. Warrick Parisian @ [facility], No previous TCS  PATIENT REVIEW QUESTIONS: The patient responded to the following health history questions as indicated:    1. Diabetes Melitis: No 2. Joint replacements in the past 12 months: No 3. Major health problems in the past 3 months: Yes, high blood pressure, PCP has been treating him for it. 4. Has an artificial valve or MVP: No 5. Has a defibrillator: No 6. Has been advised in past to take antibiotics in advance of a procedure like teeth cleaning: No 7. Family history of colon cancer: Yes grandfather age 25 8. Alcohol Use: No 9. History of sleep apnea: No  10. History of coronary artery or other vascular stents placed within the last 12 months: No 11. History of any prior anesthesia complications: No    MEDICATIONS & ALLERGIES:    Patient reports the following regarding taking any blood thinners:   Plavix? No Aspirin? Yes as needed Coumadin? No Brilinta? No Xarelto? No Eliquis? No Pradaxa? No Savaysa? No Effient? No  Patient confirms/reports the following medications:  Current Outpatient Medications  Medication Sig Dispense Refill  . amLODipine (NORVASC) 10 MG tablet Take 10 mg by mouth daily.    Marland Kitchen aspirin EC 81 MG EC tablet Take 1 tablet (81 mg total) by mouth daily. (Patient taking differently: Take 81 mg by mouth as needed. )    . clotrimazole-betamethasone (LOTRISONE) cream APPLY ONE GRAM OF CREAM TOPICALLY TWICE DAILY FOR 21 DAYS    . hydrochlorothiazide (HYDRODIURIL) 25 MG tablet Take 1 tablet (25 mg total) by mouth daily. 90 tablet 2  . lisinopril (ZESTRIL) 40 MG tablet Take 1 tablet by mouth daily.    . naproxen sodium (ALEVE) 220 MG tablet Take 220 mg by mouth as needed. For pain     No current facility-administered medications for this visit.     Patient confirms/reports the following allergies:  Allergies  Allergen Reactions  . Mushroom  Extract Complex Shortness Of Breath  . Penicillins Other (See Comments)    Dry Throat    No orders of the defined types were placed in this encounter.   AUTHORIZATION INFORMATION Primary Insurance: [place] [employer] [place],  [place] #: [id number],  Group #: [id number] Pre-Cert / Josem Kaufmann required: No, file to local BCBS  SCHEDULE INFORMATION: Procedure has been scheduled as follows:  Date: 06/06/2019, Time: 12:00 Location: [facility] with Dr. Gala Romney  This Gastroenterology Pre-Precedure Review Form is being routed to the following provider(s): Walden Field, NP

## 2019-04-02 NOTE — Patient Instructions (Addendum)
Jeffrey Potts   12-10-1970 MRN: [id number]    Procedure Date: 08/21/2019 Time to register: 12:00 pm Place to register: [facility] Procedure Time: 1:00 pm Scheduled provider: Dr. Gala Romney  PREPARATION FOR COLONOSCOPY WITH TRI-LYTE SPLIT PREP  Please notify us immediately if you are diabetic, take iron supplements, or if you are on Coumadin or any other blood thinners.    You will need to purchase 1 fleet enema and 1 box of Bisacodyl 9m tablets.   2 DAYS BEFORE PROCEDURE:  DATE: 08/19/2019   DAY: Sunday Begin clear liquid diet AFTER your lunch meal. NO SOLID FOODS after this point.  1 DAY BEFORE PROCEDURE:  DATE: 08/20/2019   DAY: Monday Continue clear liquids the entire day - NO SOLID FOOD.    At 2:00 pm:  Take 2 Bisacodyl tablets.   At 4:00pm:  Start drinking your solution. Make sure you mix well per instructions on the bottle. Try to drink 1 (one) 8 ounce glass every 10-15 minutes until you have consumed HALF the jug. You should complete by 6:00pm.You must keep the left over solution refrigerated until completed next day.  Continue clear liquids. You must drink plenty of clear liquids to prevent dehyration and kidney failure.     DAY OF PROCEDURE:   DATE: 08/21/2019  DAY: Tuesday If you take medications for your heart, blood pressure or breathing, you may take these medications.    Five hours before your procedure time @ 8:00 am:  Finish remaining amout of bowel prep, drinking 1 (one) 8 ounce glass every 10-15 minutes until complete. You have two hours to consume remaining prep.   Three hours before your procedure time @ 10:00 am:  Nothing by mouth.   At least one hour before going to the hospital:  Give yourself one Fleet enema. You may take your morning medications with sip of water unless we have instructed otherwise.      Please see below for Dietary Information.  CLEAR LIQUIDS INCLUDE:  Water Jello (NOT red in color)   Ice Popsicles (NOT red in color)    Tea (sugar ok, no milk/cream) Powdered fruit flavored drinks  Coffee (sugar ok, no milk/cream) Gatorade/ Lemonade/ Kool-Aid  (NOT red in color)   Juice: apple, white grape, white cranberry Soft drinks  Clear bullion, consomme, broth (fat free beef/chicken/vegetable)  Carbonated beverages (any kind)  Strained chicken noodle soup Hard Candy   Remember: Clear liquids are liquids that will allow you to see your fingers on the other side of a clear glass. Be sure liquids are NOT red in color, and not cloudy, but CLEAR.  DO NOT EAT OR DRINK ANY OF THE FOLLOWING:  Dairy products of any kind   Cranberry juice Tomato juice / V8 juice   Grapefruit juice Orange juice     Red grape juice  Do not eat any solid foods, including such foods as: cereal, oatmeal, yogurt, fruits, vegetables, creamed soups, eggs, bread, crackers, pureed foods in a blender, etc.   HELPFUL HINTS FOR DRINKING PREP SOLUTION:   Make sure prep is extremely cold. Mix and refrigerate the the morning of the prep. You may also put in the freezer.   You may try mixing some Crystal Light or Country Time Lemonade if you prefer. Mix in small amounts; add more if necessary.  Try drinking through a straw  Rinse mouth with water or a mouthwash between glasses, to remove after-taste.  Try sipping on a cold beverage /ice/ popsicles between  glasses of prep.  Place a piece of sugar-free hard candy in mouth between glasses.  If you become nauseated, try consuming smaller amounts, or stretch out the time between glasses. Stop for 30-60 minutes, then slowly start back drinking.        OTHER INSTRUCTIONS  You will need a responsible adult at least 50 years of age to accompany you and drive you home. This person must remain in the waiting room during your procedure. The hospital will cancel your procedure if you do not have a responsible adult with you.   1. Wear loose fitting clothing that is easily removed. 2. Leave jewelry and  other valuables at home.  3. Remove all body piercing jewelry and leave at home. 4. Total time from sign-in until discharge is approximately 2-3 hours. 5. You should go home directly after your procedure and rest. You can resume normal activities the day after your procedure. 6. The day of your procedure you should not:  Drive  Make legal decisions  Operate machinery  Drink alcohol  Return to work   You may call the office (Dept: [phone]) before 5:00pm, or page the doctor on call [phone]) after 5:00pm, for further instructions, if necessary.   Insurance Information YOU WILL NEED TO CHECK WITH YOUR INSURANCE COMPANY FOR THE BENEFITS OF COVERAGE YOU HAVE FOR THIS PROCEDURE.  UNFORTUNATELY, NOT ALL INSURANCE COMPANIES HAVE BENEFITS TO COVER ALL OR PART OF THESE TYPES OF PROCEDURES.  IT IS YOUR RESPONSIBILITY TO CHECK YOUR BENEFITS, HOWEVER, WE WILL BE GLAD TO ASSIST YOU WITH ANY CODES YOUR INSURANCE COMPANY MAY NEED.    PLEASE NOTE THAT MOST INSURANCE COMPANIES WILL NOT COVER A SCREENING COLONOSCOPY FOR PEOPLE UNDER THE AGE OF 50  IF YOU HAVE BCBS INSURANCE, YOU MAY HAVE BENEFITS FOR A SCREENING COLONOSCOPY BUT IF POLYPS ARE FOUND THE DIAGNOSIS WILL CHANGE AND THEN YOU MAY HAVE A DEDUCTIBLE THAT WILL NEED TO BE MET. SO PLEASE MAKE SURE YOU CHECK YOUR BENEFITS FOR A SCREENING COLONOSCOPY AS WELL AS A DIAGNOSTIC COLONOSCOPY.

## 2019-04-04 NOTE — Addendum Note (Signed)
Addended by: Metro Kung on: 04/04/2019 03:12 PM   Modules accepted: Orders, SmartSet

## 2019-04-04 NOTE — Progress Notes (Signed)
Ok to schedule.

## 2019-04-21 DIAGNOSIS — S91002A Unspecified open wound, left ankle, initial encounter: Secondary | ICD-10-CM | POA: Diagnosis not present

## 2019-04-21 DIAGNOSIS — Z6838 Body mass index (BMI) 38.0-38.9, adult: Secondary | ICD-10-CM | POA: Diagnosis not present

## 2019-04-21 DIAGNOSIS — L989 Disorder of the skin and subcutaneous tissue, unspecified: Secondary | ICD-10-CM | POA: Diagnosis not present

## 2019-04-21 DIAGNOSIS — L02416 Cutaneous abscess of left lower limb: Secondary | ICD-10-CM | POA: Diagnosis not present

## 2019-04-21 DIAGNOSIS — T63331A Toxic effect of venom of brown recluse spider, accidental (unintentional), initial encounter: Secondary | ICD-10-CM | POA: Diagnosis not present

## 2019-06-05 ENCOUNTER — Other Ambulatory Visit: Payer: Self-pay

## 2019-06-05 ENCOUNTER — Other Ambulatory Visit (HOSPITAL_COMMUNITY)
Admission: RE | Admit: 2019-06-05 | Discharge: 2019-06-05 | Disposition: A | Discharge status: Home / Self Care | Payer: BC Managed Care – PPO | Source: Ambulatory Visit | Attending: Internal Medicine | Admitting: Internal Medicine

## 2019-06-05 DIAGNOSIS — Z20828 Contact with and (suspected) exposure to other viral communicable diseases: Secondary | ICD-10-CM | POA: Insufficient documentation

## 2019-06-05 DIAGNOSIS — Z01812 Encounter for preprocedural laboratory examination: Secondary | ICD-10-CM | POA: Diagnosis not present

## 2019-06-05 LAB — SARS CORONAVIRUS 2 (TAT 6-24 HRS): SARS Coronavirus 2: NEGATIVE

## 2019-06-06 ENCOUNTER — Other Ambulatory Visit: Payer: Self-pay

## 2019-06-06 ENCOUNTER — Encounter (HOSPITAL_COMMUNITY)
Admission: RE | Disposition: A | Discharge status: Home / Self Care | Payer: Self-pay | Source: Home / Self Care | Attending: Internal Medicine

## 2019-06-06 ENCOUNTER — Ambulatory Visit (HOSPITAL_COMMUNITY)
Admission: RE | Admit: 2019-06-06 | Discharge: 2019-06-06 | Disposition: A | Discharge status: Home / Self Care | Payer: BC Managed Care – PPO | Attending: Internal Medicine | Admitting: Internal Medicine

## 2019-06-06 SURGERY — COLONOSCOPY
Anesthesia: Moderate Sedation

## 2019-06-06 NOTE — Addendum Note (Signed)
Addended by: Metro Kung on: 06/06/2019 11:44 AM   Modules accepted: Orders, SmartSet

## 2019-06-06 NOTE — Progress Notes (Addendum)
Spoke with Threasa Beards today and pt told her that he never received his prep instructions through the mail so he was unaware of anything he had to do to prep for procedure. Pt came by office and I apologized to him.  I informed him that after our phone visit, that I was unaware that he had not received his packet of everything discussed by phone.  We rescheduled his Covid screening to 08/17/2019 and his procedure to 08/21/2019.  Prep instructions, Covid screening info, and pre-procedure info was discussed with pt.  All questions were answered.

## 2019-06-06 NOTE — OR Nursing (Signed)
Patient scheduled for a colonoscopy. Patient arrived and stated he did not know that he was to drink a bowel prep in preparation for his procedure. Jeffrey Potts at the office notified. Patient to go by the office and get new instructions and prescription for bowel prep. Patient verbalized understanding. Dr. Gala Potts aware.

## 2019-06-07 ENCOUNTER — Telehealth: Payer: Self-pay | Admitting: Family Medicine

## 2019-06-08 ENCOUNTER — Ambulatory Visit: Payer: BC Managed Care – PPO | Admitting: Family Medicine

## 2019-07-16 ENCOUNTER — Ambulatory Visit: Payer: BC Managed Care – PPO | Admitting: Family Medicine

## 2019-07-16 ENCOUNTER — Encounter: Payer: Self-pay | Admitting: Family Medicine

## 2019-07-16 ENCOUNTER — Other Ambulatory Visit: Payer: Self-pay

## 2019-07-16 VITALS — BP 131/82 | HR 120 | Temp 97.8°F | Ht 70.0 in | Wt 258.2 lb

## 2019-07-16 DIAGNOSIS — I10 Essential (primary) hypertension: Secondary | ICD-10-CM | POA: Diagnosis not present

## 2019-07-16 DIAGNOSIS — R000 Tachycardia, unspecified: Secondary | ICD-10-CM

## 2019-07-16 MED ORDER — AMLODIPINE BESYLATE 10 MG PO TABS: 10.0000 mg | ORAL_TABLET | Freq: Every day | ORAL | 3 refills | Status: DC

## 2019-07-16 MED ORDER — LISINOPRIL 40 MG PO TABS: 40.0000 mg | ORAL_TABLET | Freq: Every day | ORAL | 3 refills | Status: DC

## 2019-07-16 MED ORDER — HYDROCHLOROTHIAZIDE 25 MG PO TABS: 25.0000 mg | ORAL_TABLET | Freq: Every day | ORAL | 3 refills | Status: DC

## 2019-07-16 MED ORDER — HYDROCHLOROTHIAZIDE 25 MG PO TABS: 25.0000 mg | ORAL_TABLET | Freq: Every day | ORAL | 2 refills | Status: DC

## 2019-07-16 NOTE — Progress Notes (Signed)
BP 131/82   Pulse (!) 120   Temp 97.8 F (36.6 C) (Temporal)   Ht 5' 10"  (1.778 m)   Wt 258 lb 3.2 oz (117.1 kg)   SpO2 96%   BMI 37.05 kg/m    Subjective:   Patient ID: Jeffrey Potts, male    DOB: Dec 30, 1970, 48 y.o.   MRN: [id number]  HPI: Jeffrey Potts is a 48 y.o. male presenting on 07/16/2019 for Hypertension   HPI Hypertension Patient is currently on lisinopril, hydrochlorothiazide and amlodipine, and their blood pressure today is 131/82. Patient denies any lightheadedness or dizziness. Patient denies headaches, blurred vision, chest pains, shortness of breath, or weakness. Denies any side effects from medication and is content with current medication.   Patient has been overworked and stressed but does show on his heart rate that he is slightly tachycardic, he denies any chest pain or palpitations or flutters and did not even know that he was feeling that way to himself.  Relevant past medical, surgical, family and social history reviewed and updated as indicated. Interim medical history since our last visit reviewed. Allergies and medications reviewed and updated.  Review of Systems  Constitutional: Negative for chills and fever.  Eyes: Negative for visual disturbance.  Respiratory: Negative for shortness of breath and wheezing.   Cardiovascular: Negative for chest pain and leg swelling.  Musculoskeletal: Negative for back pain and gait problem.  Skin: Negative for rash.  Neurological: Negative for dizziness, weakness, light-headedness and headaches.  All other systems reviewed and are negative.   Per HPI unless specifically indicated above   Allergies as of 07/16/2019      Reactions   Mushroom Extract Complex Shortness Of Breath   Penicillins Other (See Comments)   Dry Throat      Medication List       Accurate as of July 16, 2019  2:33 PM. If you have any questions, ask your nurse or doctor.        Aleve 220 MG tablet Generic drug:  naproxen sodium Take 220 mg by mouth as needed. For pain   amLODipine 10 MG tablet Commonly known as: NORVASC Take 1 tablet (10 mg total) by mouth daily.   aspirin 81 MG EC tablet Take 1 tablet (81 mg total) by mouth daily. What changed:   when to take this  reasons to take this   clotrimazole-betamethasone cream Commonly known as: LOTRISONE APPLY ONE GRAM OF CREAM TOPICALLY TWICE DAILY FOR 21 DAYS   hydrochlorothiazide 25 MG tablet Commonly known as: HYDRODIURIL Take 1 tablet (25 mg total) by mouth daily.   lisinopril 40 MG tablet Commonly known as: ZESTRIL Take 1 tablet (40 mg total) by mouth daily.        Objective:   BP 131/82   Pulse (!) 120   Temp 97.8 F (36.6 C) (Temporal)   Ht 5' 10"  (1.778 m)   Wt 258 lb 3.2 oz (117.1 kg)   SpO2 96%   BMI 37.05 kg/m   Wt Readings from Last 3 Encounters:  07/16/19 258 lb 3.2 oz (117.1 kg)  01/15/19 279 lb 12.8 oz (126.9 kg)  11/05/18 250 lb (113.4 kg)    Physical Exam Vitals and nursing note reviewed.  Constitutional:      General: He is not in acute distress.    Appearance: He is well-developed. He is not diaphoretic.  HENT:     Right Ear: External ear normal.     Left Ear: External ear normal.  Nose: Nose normal.     Mouth/Throat:     Pharynx: No oropharyngeal exudate.  Eyes:     General: No scleral icterus.       Right eye: No discharge.     Extraocular Movements: Extraocular movements intact.     Conjunctiva/sclera: Conjunctivae normal.     Pupils: Pupils are equal, round, and reactive to light.  Neck:     Thyroid: No thyromegaly.  Cardiovascular:     Rate and Rhythm: Regular rhythm. Tachycardia present.     Heart sounds: Normal heart sounds. No murmur.  Pulmonary:     Effort: Pulmonary effort is normal. No respiratory distress.     Breath sounds: Normal breath sounds. No wheezing.  Abdominal:     General: Bowel sounds are normal. There is no distension.     Palpations: Abdomen is soft.      Tenderness: There is no abdominal tenderness. There is no guarding or rebound.  Musculoskeletal:        General: Normal range of motion.     Cervical back: Neck supple.  Lymphadenopathy:     Cervical: No cervical adenopathy.  Skin:    General: Skin is warm and dry.     Findings: No rash.  Neurological:     Mental Status: He is alert and oriented to person, place, and time.     Coordination: Coordination normal.  Psychiatric:        Behavior: Behavior normal.     EKG: Sinus tachycardia, no other abnormalities.  We will continue to monitor, we did recommend him earlier to go for cardiac screening and will still recommend that to go as well, he will have a referral department call them up and they will contact him again.  Assessment & Plan:   Problem List Items Addressed This Visit      Cardiovascular and Mediastinum   Hypertension - Primary   Relevant Medications   lisinopril (ZESTRIL) 40 MG tablet   amLODipine (NORVASC) 10 MG tablet   hydrochlorothiazide (HYDRODIURIL) 25 MG tablet   Other Relevant Orders   CBC with Differential/Platelet   CMP14+EGFR   Lipid panel   EKG 12-Lead (Completed)    Other Visit Diagnoses    Tachycardia       Relevant Orders   EKG 12-Lead (Completed)      No change in medication for now, he is asymptomatic tachycardia and if he has increases then we will monitor from there. Follow up plan: Return in about 6 months (around 01/14/2020), or if symptoms worsen or fail to improve, for Hypertension follow-up.  Counseling provided for all of the vaccine components Orders Placed This Encounter  Procedures  . CBC with Differential/Platelet  . CMP14+EGFR  . Lipid panel  . EKG 12-Lead    Caryl Pina, MD [facility] 07/16/2019, 2:33 PM

## 2019-07-17 LAB — CBC WITH DIFFERENTIAL/PLATELET
Basophils Absolute: 0 10*3/uL (ref 0.0–0.2)
Basos: 0 %
EOS (ABSOLUTE): 0.2 10*3/uL (ref 0.0–0.4)
Eos: 2 %
Hematocrit: 52.6 % — ABNORMAL HIGH (ref 37.5–51.0)
Hemoglobin: 17.8 g/dL — ABNORMAL HIGH (ref 13.0–17.7)
Immature Grans (Abs): 0 10*3/uL (ref 0.0–0.1)
Immature Granulocytes: 0 %
Lymphocytes Absolute: 1.7 10*3/uL (ref 0.7–3.1)
Lymphs: 18 %
MCH: 29.2 pg (ref 26.6–33.0)
MCHC: 33.8 g/dL (ref 31.5–35.7)
MCV: 86 fL (ref 79–97)
Monocytes Absolute: 0.6 10*3/uL (ref 0.1–0.9)
Monocytes: 6 %
Neutrophils Absolute: 7 10*3/uL (ref 1.4–7.0)
Neutrophils: 74 %
Platelets: 298 10*3/uL (ref 150–450)
RBC: 6.1 x10E6/uL — ABNORMAL HIGH (ref 4.14–5.80)
RDW: 13.5 % (ref 11.6–15.4)
WBC: 9.5 10*3/uL (ref 3.4–10.8)

## 2019-07-17 LAB — CMP14+EGFR
ALT: 34 IU/L (ref 0–44)
AST: 18 IU/L (ref 0–40)
Albumin/Globulin Ratio: 1.5 (ref 1.2–2.2)
Albumin: 4.4 g/dL (ref 4.0–5.0)
Alkaline Phosphatase: 79 IU/L (ref 39–117)
BUN/Creatinine Ratio: 19 (ref 9–20)
BUN: 23 mg/dL (ref 6–24)
Bilirubin Total: 0.5 mg/dL (ref 0.0–1.2)
CO2: 19 mmol/L — ABNORMAL LOW (ref 20–29)
Calcium: 10.9 mg/dL — ABNORMAL HIGH (ref 8.7–10.2)
Chloride: 102 mmol/L (ref 96–106)
Creatinine, Ser: 1.2 mg/dL (ref 0.76–1.27)
GFR calc Af Amer: 81 mL/min/{1.73_m2} (ref >59)
GFR calc non Af Amer: 70 mL/min/{1.73_m2} (ref >59)
Globulin, Total: 3 g/dL (ref 1.5–4.5)
Glucose: 165 mg/dL — ABNORMAL HIGH (ref 65–99)
Potassium: 3.6 mmol/L (ref 3.5–5.2)
Sodium: 138 mmol/L (ref 134–144)
Total Protein: 7.4 g/dL (ref 6.0–8.5)

## 2019-07-17 LAB — LIPID PANEL
Chol/HDL Ratio: 3.7 ratio (ref 0.0–5.0)
Cholesterol, Total: 167 mg/dL (ref 100–199)
HDL: 45 mg/dL (ref >39)
LDL Chol Calc (NIH): 86 mg/dL (ref 0–99)
Triglycerides: 212 mg/dL — ABNORMAL HIGH (ref 0–149)
VLDL Cholesterol Cal: 36 mg/dL (ref 5–40)

## 2019-07-21 LAB — HGB A1C W/O EAG: Hgb A1c MFr Bld: 5.3 % (ref 4.8–5.6)

## 2019-07-21 LAB — SPECIMEN STATUS REPORT

## 2019-07-23 ENCOUNTER — Encounter: Payer: Self-pay | Admitting: *Deleted

## 2019-08-13 ENCOUNTER — Telehealth: Payer: Self-pay | Admitting: Internal Medicine

## 2019-08-13 NOTE — Telephone Encounter (Signed)
Called pt back.  Informed him that we would cancel his Covid screening and procedure on 08/21/2019 per request.  Made pt aware that due to Covid surge, we can't reschedule his procedure right now.  He is aware that we will call him to reschedule once we have clearance.  Pt voiced understanding.  Eber Jones in Endo made aware.

## 2019-08-13 NOTE — Telephone Encounter (Signed)
Called pt back but vm was not set up.

## 2019-08-13 NOTE — Telephone Encounter (Signed)
Pt called to reschedule his colonoscopy with [facility] on 08/21/2019. [phone]

## 2019-08-17 ENCOUNTER — Other Ambulatory Visit (HOSPITAL_COMMUNITY): Payer: BC Managed Care – PPO

## 2019-08-21 ENCOUNTER — Encounter (HOSPITAL_COMMUNITY): Payer: Self-pay

## 2019-08-21 ENCOUNTER — Ambulatory Visit (HOSPITAL_COMMUNITY): Admit: 2019-08-21 | Payer: BC Managed Care – PPO | Admitting: Internal Medicine

## 2019-08-21 SURGERY — COLONOSCOPY
Anesthesia: Moderate Sedation

## 2019-08-30 ENCOUNTER — Telehealth: Payer: Self-pay | Admitting: *Deleted

## 2019-08-30 NOTE — Telephone Encounter (Addendum)
Called pt to reschedule his procedure.  VM is full so unable to leave message.

## 2019-08-30 NOTE — Telephone Encounter (Signed)
Pt called back and said that he was unable to reschedule his procedure right now.  He said that he was driving but would call me back when he got a chance.

## 2019-11-07 ENCOUNTER — Telehealth (INDEPENDENT_AMBULATORY_CARE_PROVIDER_SITE_OTHER): Payer: BC Managed Care – PPO | Admitting: Family Medicine

## 2019-11-07 ENCOUNTER — Encounter: Payer: Self-pay | Admitting: Family Medicine

## 2019-11-07 DIAGNOSIS — R29898 Other symptoms and signs involving the musculoskeletal system: Secondary | ICD-10-CM

## 2019-11-07 MED ORDER — PREDNISONE 20 MG PO TABS: ORAL_TABLET | ORAL | 0 refills | Status: DC

## 2019-11-07 NOTE — Progress Notes (Signed)
Virtual Visit via telephone Note  I connected with Jeffrey Potts on 11/07/19 at 1555 by telephone and verified that I am speaking with the correct person using two identifiers. Jeffrey Potts is currently located at home and no other people are currently with her during visit. The provider, Fransisca Kaufmann Brie Eppard, MD is located in their office at time of visit.  Call ended at 1515  I discussed the limitations, risks, security and privacy concerns of performing an evaluation and management service by telephone and the availability of in person appointments. I also discussed with the patient that there may be a patient responsible charge related to this service. The patient expressed understanding and agreed to proceed.   History and Present Illness: Patient is calling today for issues over the past few days that he is having issues with dropping things.  He was playing put-put and was losing grip strength on his putter.  He dropped his spoon while eating.  He is dropping pens and other small things.  He denies numbness or pain in the hand or wrist. He is having issues with both hands.  He has not noticed any significant neck pain. He denies any issues with numbness or weakness.  He denies trauma.   1. Decreased grip strength     Outpatient Encounter Medications as of 11/07/2019  Medication Sig  . amLODipine (NORVASC) 10 MG tablet Take 1 tablet (10 mg total) by mouth daily.  Marland Kitchen aspirin EC 81 MG EC tablet Take 1 tablet (81 mg total) by mouth daily. (Patient taking differently: Take 81 mg by mouth as needed. )  . clotrimazole-betamethasone (LOTRISONE) cream APPLY ONE GRAM OF CREAM TOPICALLY TWICE DAILY FOR 21 DAYS  . hydrochlorothiazide (HYDRODIURIL) 25 MG tablet Take 1 tablet (25 mg total) by mouth daily.  Marland Kitchen lisinopril (ZESTRIL) 40 MG tablet Take 1 tablet (40 mg total) by mouth daily.  . naproxen sodium (ALEVE) 220 MG tablet Take 220 mg by mouth as needed. For pain  . predniSONE (DELTASONE)  20 MG tablet 2 po at same time daily for 5 days   No facility-administered encounter medications on file as of 11/07/2019.    Review of Systems  Constitutional: Negative for chills and fever.  Eyes: Negative for visual disturbance.  Respiratory: Negative for shortness of breath and wheezing.   Cardiovascular: Negative for chest pain and leg swelling.  Musculoskeletal: Negative for arthralgias, back pain, gait problem and neck pain.  Skin: Negative for rash.  Neurological: Positive for weakness. Negative for dizziness and numbness.  All other systems reviewed and are negative.   Observations/Objective: Patient sounds comfortable and in no acute distress  Assessment and Plan: Problem List Items Addressed This Visit    None    Visit Diagnoses    Decreased grip strength    -  Primary   Relevant Medications   predniSONE (DELTASONE) 20 MG tablet   Other Relevant Orders   Ambulatory referral to Neurology      With bilateral decreased grip strength, some concern for possible carpal tunnel or nerve issues from his neck, he is unable to elicit any other symptoms over the phone that would fit anything specifically so we will do a short course of prednisone for anti-inflammatory and if not improved will have a neurology referral in place for possible EMG Follow up plan: Return if symptoms worsen or fail to improve.     I discussed the assessment and treatment plan with the patient. The patient was provided  an opportunity to ask questions and all were answered. The patient agreed with the plan and demonstrated an understanding of the instructions.   The patient was advised to call back or seek an in-person evaluation if the symptoms worsen or if the condition fails to improve as anticipated.  The above assessment and management plan was discussed with the patient. The patient verbalized understanding of and has agreed to the management plan. Patient is aware to call the clinic if symptoms  persist or worsen. Patient is aware when to return to the clinic for a follow-up visit. Patient educated on when it is appropriate to go to the emergency department.    I provided 20 minutes of non-face-to-face time during this encounter.    Nils Pyle, MD

## 2020-01-14 ENCOUNTER — Encounter: Payer: Self-pay | Admitting: Family Medicine

## 2020-01-14 ENCOUNTER — Ambulatory Visit: Payer: BC Managed Care – PPO | Admitting: Family Medicine

## 2020-01-14 ENCOUNTER — Other Ambulatory Visit: Payer: Self-pay

## 2020-01-14 VITALS — BP 138/7 | HR 84 | Temp 98.5°F | Ht 70.0 in | Wt 281.0 lb

## 2020-01-14 DIAGNOSIS — Z6841 Body Mass Index (BMI) 40.0 and over, adult: Secondary | ICD-10-CM

## 2020-01-14 DIAGNOSIS — R7309 Other abnormal glucose: Secondary | ICD-10-CM

## 2020-01-14 DIAGNOSIS — I10 Essential (primary) hypertension: Secondary | ICD-10-CM

## 2020-01-14 DIAGNOSIS — R29898 Other symptoms and signs involving the musculoskeletal system: Secondary | ICD-10-CM

## 2020-01-14 DIAGNOSIS — Z9189 Other specified personal risk factors, not elsewhere classified: Secondary | ICD-10-CM

## 2020-01-14 LAB — BAYER DCA HB A1C WAIVED: HB A1C (BAYER DCA - WAIVED): 6 % (ref <7.0)

## 2020-01-14 NOTE — Progress Notes (Signed)
BP (!) 138/7   Pulse 84   Temp 98.5 F (36.9 C)   Ht 5' 10"  (1.778 m)   Wt 281 lb (127.5 kg)   SpO2 97%   BMI 40.32 kg/m    Subjective:   Patient ID: Jeffrey Potts, male    DOB: 1970-12-20, 49 y.o.   MRN: [id number]  HPI: Jeffrey Potts is a 49 y.o. male presenting on 01/14/2020 for Medical Management of Chronic Issues, Hypertension, and Decreased grip in bilateral hands   HPI Hypertension Patient is currently on amlodipine and lisinopril and hydrochlorothiazide, and their blood pressure today is 138/70. Patient denies any lightheadedness or dizziness. Patient denies headaches, blurred vision, chest pains, shortness of breath, or weakness. Denies any side effects from medication and is content with current medication.   Patient is coming in today with continued issues with grip strength and loss of strength for at least the past few months.  Both hands and then has some tingling and numbness in the ends of his fingers on both hands.  He also has some neck tightness that is been more recently.  Patient also wants a new referral for nutrition and for cardiac for screening  Relevant past medical, surgical, family and social history reviewed and updated as indicated. Interim medical history since our last visit reviewed. Allergies and medications reviewed and updated.  Review of Systems  Constitutional: Negative for chills and fever.  Eyes: Negative for visual disturbance.  Respiratory: Negative for shortness of breath and wheezing.   Cardiovascular: Negative for chest pain and leg swelling.  Musculoskeletal: Positive for arthralgias. Negative for back pain and gait problem.  Skin: Negative for rash.  Neurological: Positive for weakness and numbness.  All other systems reviewed and are negative.   Per HPI unless specifically indicated above   Allergies as of 01/14/2020      Reactions   Mushroom Extract Complex Shortness Of Breath   Penicillins Other (See Comments)    Dry Throat      Medication List       Accurate as of January 14, 2020 10:07 AM. If you have any questions, ask your nurse or doctor.        STOP taking these medications   clotrimazole-betamethasone cream Commonly known as: LOTRISONE Stopped by: Fransisca Kaufmann Akina Maish, MD   predniSONE 20 MG tablet Commonly known as: DELTASONE Stopped by: Fransisca Kaufmann Kieren Ricci, MD     TAKE these medications   Aleve 220 MG tablet Generic drug: naproxen sodium Take 220 mg by mouth as needed. For pain   amLODipine 10 MG tablet Commonly known as: NORVASC Take 1 tablet (10 mg total) by mouth daily.   aspirin 81 MG EC tablet Take 1 tablet (81 mg total) by mouth daily. What changed:   when to take this  reasons to take this   hydrochlorothiazide 25 MG tablet Commonly known as: HYDRODIURIL Take 1 tablet (25 mg total) by mouth daily.   lisinopril 40 MG tablet Commonly known as: ZESTRIL Take 1 tablet (40 mg total) by mouth daily.        Objective:   BP (!) 138/7   Pulse 84   Temp 98.5 F (36.9 C)   Ht 5' 10"  (1.778 m)   Wt 281 lb (127.5 kg)   SpO2 97%   BMI 40.32 kg/m   Wt Readings from Last 3 Encounters:  01/14/20 281 lb (127.5 kg)  07/16/19 258 lb 3.2 oz (117.1 kg)  01/15/19 279 lb 12.8 oz (  126.9 kg)    Physical Exam Vitals and nursing note reviewed.  Constitutional:      General: He is not in acute distress.    Appearance: He is well-developed. He is not diaphoretic.  Eyes:     General: No scleral icterus.    Conjunctiva/sclera: Conjunctivae normal.  Neck:     Thyroid: No thyromegaly.  Cardiovascular:     Rate and Rhythm: Normal rate and regular rhythm.     Heart sounds: Normal heart sounds. No murmur heard.   Pulmonary:     Effort: Pulmonary effort is normal. No respiratory distress.     Breath sounds: Normal breath sounds. No wheezing.  Musculoskeletal:        General: Normal range of motion.     Cervical back: Neck supple.     Comments: No weakness noted, negative  for Tinel's sign and Phalen's slight tenderness in right side of the neck with range of motion and palpation in the musculature  Lymphadenopathy:     Cervical: No cervical adenopathy.  Skin:    General: Skin is warm and dry.     Findings: No rash.  Neurological:     Mental Status: He is alert and oriented to person, place, and time.     Coordination: Coordination normal.  Psychiatric:        Behavior: Behavior normal.       Assessment & Plan:   Problem List Items Addressed This Visit      Cardiovascular and Mediastinum   Hypertension - Primary   Relevant Orders   CBC with Differential/Platelet   CMP14+EGFR   Lipid panel   Ambulatory referral to Cardiology     Other   Obesity   Relevant Orders   Amb ref to Medical Nutrition Therapy-MNT   Ambulatory referral to Cardiology    Other Visit Diagnoses    Decreased grip strength       Relevant Orders   Ambulatory referral to Neurology   Elevated glucose       Relevant Orders   Bayer DCA Hb A1c Waived   Amb ref to Medical Nutrition Therapy-MNT   [place] cardiac risk 10-20% in next 10 years       Relevant Orders   Ambulatory referral to Cardiology      Concerned about grip strength and nerve impingement, possibly from spinal but possibly carpal tunnel does not testing positive on exam.  We will do a new referral for neurology  We will do neuro referral for nutrition and cardiology as well. Follow up plan: Return in about 6 months (around 07/15/2020), or if symptoms worsen or fail to improve, for Follow-up hypertension.  Counseling provided for all of the vaccine components Orders Placed This Encounter  Procedures  . CBC with Differential/Platelet  . CMP14+EGFR  . Lipid panel  . Bayer DCA Hb A1c Waived  . Ambulatory referral to Neurology  . Amb ref to Medical Nutrition Therapy-MNT  . Ambulatory referral to Cardiology    Caryl Pina, MD [facility] 01/14/2020, 10:07 AM

## 2020-01-15 LAB — CMP14+EGFR
ALT: 32 IU/L (ref 0–44)
AST: 19 IU/L (ref 0–40)
Albumin/Globulin Ratio: 1.5 (ref 1.2–2.2)
Albumin: 4.2 g/dL (ref 3.8–4.9)
Alkaline Phosphatase: 65 IU/L (ref 48–121)
BUN/Creatinine Ratio: 19 (ref 9–20)
BUN: 22 mg/dL (ref 6–24)
Bilirubin Total: 0.4 mg/dL (ref 0.0–1.2)
CO2: 19 mmol/L — ABNORMAL LOW (ref 20–29)
Calcium: 9.5 mg/dL (ref 8.7–10.2)
Chloride: 103 mmol/L (ref 96–106)
Creatinine, Ser: 1.13 mg/dL (ref 0.76–1.27)
GFR calc Af Amer: 87 mL/min/{1.73_m2} (ref >59)
GFR calc non Af Amer: 75 mL/min/{1.73_m2} (ref >59)
Globulin, Total: 2.8 g/dL (ref 1.5–4.5)
Glucose: 114 mg/dL — ABNORMAL HIGH (ref 65–99)
Potassium: 4.2 mmol/L (ref 3.5–5.2)
Sodium: 139 mmol/L (ref 134–144)
Total Protein: 7 g/dL (ref 6.0–8.5)

## 2020-01-15 LAB — LIPID PANEL
Chol/HDL Ratio: 4 ratio (ref 0.0–5.0)
Cholesterol, Total: 159 mg/dL (ref 100–199)
HDL: 40 mg/dL (ref >39)
LDL Chol Calc (NIH): 90 mg/dL (ref 0–99)
Triglycerides: 167 mg/dL — ABNORMAL HIGH (ref 0–149)
VLDL Cholesterol Cal: 29 mg/dL (ref 5–40)

## 2020-01-15 LAB — CBC WITH DIFFERENTIAL/PLATELET
Basophils Absolute: 0.1 10*3/uL (ref 0.0–0.2)
Basos: 1 %
EOS (ABSOLUTE): 0.2 10*3/uL (ref 0.0–0.4)
Eos: 3 %
Hematocrit: 44 % (ref 37.5–51.0)
Hemoglobin: 15.1 g/dL (ref 13.0–17.7)
Immature Grans (Abs): 0 10*3/uL (ref 0.0–0.1)
Immature Granulocytes: 0 %
Lymphocytes Absolute: 1.4 10*3/uL (ref 0.7–3.1)
Lymphs: 22 %
MCH: 29.8 pg (ref 26.6–33.0)
MCHC: 34.3 g/dL (ref 31.5–35.7)
MCV: 87 fL (ref 79–97)
Monocytes Absolute: 0.6 10*3/uL (ref 0.1–0.9)
Monocytes: 9 %
Neutrophils Absolute: 4.2 10*3/uL (ref 1.4–7.0)
Neutrophils: 65 %
Platelets: 237 10*3/uL (ref 150–450)
RBC: 5.06 x10E6/uL (ref 4.14–5.80)
RDW: 13 % (ref 11.6–15.4)
WBC: 6.5 10*3/uL (ref 3.4–10.8)

## 2020-01-17 ENCOUNTER — Telehealth: Payer: Self-pay | Admitting: Family Medicine

## 2020-01-18 NOTE — Telephone Encounter (Signed)
Go ahead and do the referral and have him call their office every week and see if they have any cancellations and sometimes he can get in sooner with that.  Have him call their office and get on the cancellation list Arville Care, MD [facility] 01/18/2020, 8:03 AM

## 2020-01-23 NOTE — Telephone Encounter (Signed)
Called patient no answer and no voicemail  

## 2020-02-19 ENCOUNTER — Encounter: Payer: Self-pay | Admitting: Family Medicine

## 2020-03-20 ENCOUNTER — Telehealth: Payer: Self-pay | Admitting: Internal Medicine

## 2020-03-20 NOTE — Telephone Encounter (Signed)
Pt said he had been triaged last year and had to cancel his procedure. He is ready to reschedule now. Does he need another nurse visit to triage or does he need to come in the office and see an extender? Please advise. [phone]

## 2020-03-20 NOTE — Telephone Encounter (Signed)
Pt is aware of Nurse VIsit

## 2020-03-20 NOTE — Telephone Encounter (Signed)
Nurse visit please

## 2020-05-08 ENCOUNTER — Ambulatory Visit: Payer: BC Managed Care – PPO

## 2020-06-18 ENCOUNTER — Ambulatory Visit: Payer: Self-pay

## 2020-06-18 ENCOUNTER — Other Ambulatory Visit: Payer: Self-pay

## 2020-06-18 ENCOUNTER — Encounter: Payer: Self-pay | Admitting: Internal Medicine

## 2020-07-16 ENCOUNTER — Ambulatory Visit: Payer: BC Managed Care – PPO | Admitting: Family Medicine

## 2020-08-16 DIAGNOSIS — M791 Myalgia, unspecified site: Secondary | ICD-10-CM | POA: Diagnosis not present

## 2020-08-16 DIAGNOSIS — J029 Acute pharyngitis, unspecified: Secondary | ICD-10-CM | POA: Diagnosis not present

## 2020-08-16 DIAGNOSIS — I10 Essential (primary) hypertension: Secondary | ICD-10-CM | POA: Diagnosis not present

## 2020-08-22 ENCOUNTER — Telehealth: Payer: Self-pay | Admitting: Family Medicine

## 2020-08-22 MED ORDER — AMLODIPINE BESYLATE 10 MG PO TABS: 10.0000 mg | ORAL_TABLET | Freq: Every day | ORAL | 0 refills | Status: DC

## 2020-08-22 MED ORDER — LISINOPRIL 40 MG PO TABS: 40.0000 mg | ORAL_TABLET | Freq: Every day | ORAL | 0 refills | Status: DC

## 2020-08-22 NOTE — Telephone Encounter (Signed)
  Prescription Request  08/22/2020  What is the name of the medication or equipment? Amlodipine and lisinopril  Have you contacted your pharmacy to request a refill? (if applicable) yes - has about two weeks left   Which pharmacy would you like this sent to? [facility] in [place]   Pt had appt for Wednesday 1/26 but tested positive for COVID and wanted to r/s. Appt for 3/16   Patient notified that their request is being sent to the clinical staff for review and that they should receive a response within 2 business days.

## 2020-08-22 NOTE — Telephone Encounter (Signed)
Pt aware refills sent to pharmacy 

## 2020-08-25 ENCOUNTER — Telehealth: Payer: Self-pay

## 2020-08-25 MED ORDER — HYDROCHLOROTHIAZIDE 25 MG PO TABS: 25.0000 mg | ORAL_TABLET | Freq: Every day | ORAL | 0 refills | Status: DC

## 2020-08-25 NOTE — Telephone Encounter (Signed)
NA/VM full refill sent to pharmacy 

## 2020-08-25 NOTE — Telephone Encounter (Signed)
  Prescription Request  08/25/2020  What is the name of the medication or equipment? Hydrochlothyside  Have you contacted your pharmacy to request a refill? (if applicable) yes  Which pharmacy would you like this sent to? [facility]   Patient notified that their request is being sent to the clinical staff for review and that they should receive a response within 2 business days.   Dettinger's pt.  This is the only missing!  Please call pt.

## 2020-08-27 ENCOUNTER — Ambulatory Visit: Payer: BC Managed Care – PPO | Admitting: Family Medicine

## 2020-10-15 ENCOUNTER — Other Ambulatory Visit: Payer: Self-pay

## 2020-10-15 ENCOUNTER — Encounter: Payer: Self-pay | Admitting: Family Medicine

## 2020-10-15 ENCOUNTER — Ambulatory Visit: Payer: BC Managed Care – PPO | Admitting: Family Medicine

## 2020-10-15 VITALS — BP 121/76 | HR 94 | Ht 70.0 in | Wt 272.0 lb

## 2020-10-15 DIAGNOSIS — Z1159 Encounter for screening for other viral diseases: Secondary | ICD-10-CM

## 2020-10-15 DIAGNOSIS — Z1211 Encounter for screening for malignant neoplasm of colon: Secondary | ICD-10-CM

## 2020-10-15 DIAGNOSIS — I10 Essential (primary) hypertension: Secondary | ICD-10-CM

## 2020-10-15 DIAGNOSIS — N39498 Other specified urinary incontinence: Secondary | ICD-10-CM

## 2020-10-15 DIAGNOSIS — E1169 Type 2 diabetes mellitus with other specified complication: Secondary | ICD-10-CM | POA: Insufficient documentation

## 2020-10-15 DIAGNOSIS — Z114 Encounter for screening for human immunodeficiency virus [HIV]: Secondary | ICD-10-CM

## 2020-10-15 DIAGNOSIS — R7303 Prediabetes: Secondary | ICD-10-CM

## 2020-10-15 LAB — URINALYSIS, COMPLETE
Bilirubin, UA: NEGATIVE
Glucose, UA: NEGATIVE
Ketones, UA: NEGATIVE
Leukocytes,UA: NEGATIVE
Nitrite, UA: NEGATIVE
Protein,UA: NEGATIVE
Specific Gravity, UA: 1.025 (ref 1.005–1.030)
Urobilinogen, Ur: 1 mg/dL (ref 0.2–1.0)
pH, UA: 5 (ref 5.0–7.5)

## 2020-10-15 LAB — MICROSCOPIC EXAMINATION
Bacteria, UA: NONE SEEN
Epithelial Cells (non renal): NONE SEEN /hpf (ref 0–10)
RBC, Urine: NONE SEEN /hpf (ref 0–2)
WBC, UA: NONE SEEN /hpf (ref 0–5)

## 2020-10-15 LAB — BAYER DCA HB A1C WAIVED: HB A1C (BAYER DCA - WAIVED): 6.1 % (ref <7.0)

## 2020-10-15 MED ORDER — LISINOPRIL 40 MG PO TABS
40.0000 mg | ORAL_TABLET | Freq: Every day | ORAL | 3 refills | Status: DC
Start: 2020-10-15 — End: 2021-08-17

## 2020-10-15 MED ORDER — AMLODIPINE BESYLATE 10 MG PO TABS: 10.0000 mg | ORAL_TABLET | Freq: Every day | ORAL | 3 refills | Status: DC

## 2020-10-15 MED ORDER — METOPROLOL SUCCINATE ER 50 MG PO TB24: 50.0000 mg | ORAL_TABLET | Freq: Every day | ORAL | 3 refills | Status: DC

## 2020-10-15 NOTE — Progress Notes (Signed)
BP 121/76   Pulse 94   Ht 5' 10"  (1.778 m)   Wt 272 lb (123.4 kg)   SpO2 97%   BMI 39.03 kg/m    Subjective:   Patient ID: Jeffrey Potts, male    DOB: 10-31-1970, 50 y.o.   MRN: [id number]  HPI: Jeffrey Potts is a 50 y.o. male presenting on 10/15/2020 for Medical Management of Chronic Issues and Hypertension   HPI Prediabetes Patient comes in today for recheck of his diabetes. Patient has been currently taking no medication, will check A1c today. Patient is currently on an ACE inhibitor/ARB. Patient has not seen an ophthalmologist this year. Patient denies any issues with their feet. 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 Patient denies any lightheadedness or dizziness. Patient denies headaches, blurred vision, chest pains, shortness of breath, or weakness. Denies any side effects from medication and is content with current medication.   Patient's hypertension and prediabetes are more complicated by the patient's morbid obesity.  Discussed weight loss and lifestyle modification and exercise with the patient.   Relevant past medical, surgical, family and social history reviewed and updated as indicated. Interim medical history since our last visit reviewed. Allergies and medications reviewed and updated.  Review of Systems  Constitutional: Negative for chills and fever.  Eyes: Negative for discharge.  Respiratory: Negative for shortness of breath and wheezing.   Cardiovascular: Negative for chest pain and leg swelling.  Gastrointestinal: Negative for abdominal pain.  Genitourinary: Positive for  frequency. Negative for decreased urine volume, dysuria, hematuria and urgency.  Musculoskeletal: Negative for back pain and gait problem.  Skin: Negative for rash.  All other systems reviewed and are negative.   Per HPI unless specifically indicated above   Allergies as of 10/15/2020      Reactions   Mushroom Extract Complex Shortness Of Breath   Penicillins Other (See Comments)   Dry Throat      Medication List       Accurate as of October 15, 2020  9:08 AM. If you have any questions, ask your nurse or doctor.        amLODipine 10 MG tablet Commonly known as: NORVASC Take 1 tablet (10 mg total) by mouth daily.   aspirin 81 MG EC tablet Take 1 tablet (81 mg total) by mouth daily. What changed:   when to take this  reasons to take this   hydrochlorothiazide 25 MG tablet Commonly known as: HYDRODIURIL Take 1 tablet (25 mg total) by mouth daily.   lisinopril 40 MG tablet Commonly known as: ZESTRIL Take 1 tablet (40 mg total) by mouth daily.   naproxen sodium 220 MG tablet Commonly known as: ALEVE Take 220 mg by mouth as needed. For pain        Objective:   BP 121/76   Pulse 94   Ht 5' 10"  (1.778 m)   Wt 272 lb (123.4 kg)   SpO2 97%   BMI 39.03 kg/m   Wt Readings from Last 3 Encounters:  10/15/20 272 lb (123.4 kg)  01/14/20 281 lb (127.5 kg)  07/16/19 258  lb 3.2 oz (117.1 kg)    Physical Exam Vitals and nursing note reviewed.  Constitutional:      General: He is not in acute distress.    Appearance: He is well-developed. He is not diaphoretic.  Eyes:     General: No scleral icterus.    Conjunctiva/sclera: Conjunctivae normal.  Neck:     Thyroid: No thyromegaly.  Cardiovascular:     Rate and Rhythm: Normal rate and regular rhythm.     Heart sounds: Normal heart sounds. No murmur heard.   Pulmonary:     Effort: Pulmonary effort is normal. No respiratory distress.     Breath sounds: Normal breath sounds. No wheezing.  Musculoskeletal:         General: Normal range of motion.     Cervical back: Neck supple.  Lymphadenopathy:     Cervical: No cervical adenopathy.  Skin:    General: Skin is warm and dry.     Findings: No rash.  Neurological:     Mental Status: He is alert and oriented to person, place, and time.     Coordination: Coordination normal.  Psychiatric:        Behavior: Behavior normal.       Assessment & Plan:   Problem List Items Addressed This Visit      Cardiovascular and Mediastinum   Hypertension - Primary   Relevant Medications   amLODipine (NORVASC) 10 MG tablet   lisinopril (ZESTRIL) 40 MG tablet   metoprolol succinate (TOPROL-XL) 50 MG 24 hr tablet   Other Relevant Orders   CBC with Differential/Platelet   CMP14+EGFR   Lipid panel     Other   Morbid obesity (HCC)   Relevant Orders   CBC with Differential/Platelet   Lipid panel   Prediabetes   Relevant Orders   CBC with Differential/Platelet   Bayer DCA Hb A1c Waived   Urinalysis, Complete    Other Visit Diagnoses    Screening for HIV without presence of risk factors       Relevant Orders   HIV Antibody (routine testing w rflx)   Need for hepatitis C screening test       Relevant Orders   Hepatitis C antibody   Other urinary incontinence          Will switch off of hydrochlorothiazide and onto metoprolol to see if it helps better with his blood pressure and urinary incontinence  We will check his urinalysis to make sure nothing is going on with his urinary continence but has been going on for quite many months, could also be prostate issues but will try off of the hydrochlorothiazide first, if does not improve may try Flomax in the future. Follow up plan: Return in about 6 months (around 04/17/2021), or if symptoms worsen or fail to improve, for Prediabetes and hypertension.  Counseling provided for all of the vaccine components No orders of the defined types were placed in this encounter.   Caryl Pina, MD [facility] 10/15/2020, 9:08 AM

## 2020-10-15 NOTE — Addendum Note (Signed)
Addended by: Arville Care on: 10/15/2020 09:31 AM   Modules accepted: Orders

## 2020-10-16 LAB — CMP14+EGFR
ALT: 57 IU/L — ABNORMAL HIGH (ref 0–44)
AST: 26 IU/L (ref 0–40)
Albumin/Globulin Ratio: 1.8 (ref 1.2–2.2)
Albumin: 4.9 g/dL (ref 3.8–4.9)
Alkaline Phosphatase: 81 IU/L (ref 44–121)
BUN/Creatinine Ratio: 19 (ref 9–20)
BUN: 21 mg/dL (ref 6–24)
Bilirubin Total: 0.6 mg/dL (ref 0.0–1.2)
CO2: 19 mmol/L — ABNORMAL LOW (ref 20–29)
Calcium: 10.8 mg/dL — ABNORMAL HIGH (ref 8.7–10.2)
Chloride: 99 mmol/L (ref 96–106)
Creatinine, Ser: 1.1 mg/dL (ref 0.76–1.27)
Globulin, Total: 2.8 g/dL (ref 1.5–4.5)
Glucose: 105 mg/dL — ABNORMAL HIGH (ref 65–99)
Potassium: 4 mmol/L (ref 3.5–5.2)
Sodium: 137 mmol/L (ref 134–144)
Total Protein: 7.7 g/dL (ref 6.0–8.5)
eGFR: 81 mL/min/{1.73_m2} (ref >59)

## 2020-10-16 LAB — HEPATITIS C ANTIBODY: Hep C Virus Ab: <0.1 s/co ratio (ref 0.0–0.9)

## 2020-10-16 LAB — CBC WITH DIFFERENTIAL/PLATELET
Basophils Absolute: 0.1 10*3/uL (ref 0.0–0.2)
Basos: 1 %
EOS (ABSOLUTE): 0.4 10*3/uL (ref 0.0–0.4)
Eos: 5 %
Hematocrit: 49.9 % (ref 37.5–51.0)
Hemoglobin: 17.2 g/dL (ref 13.0–17.7)
Immature Grans (Abs): 0 10*3/uL (ref 0.0–0.1)
Immature Granulocytes: 0 %
Lymphocytes Absolute: 2 10*3/uL (ref 0.7–3.1)
Lymphs: 23 %
MCH: 29.6 pg (ref 26.6–33.0)
MCHC: 34.5 g/dL (ref 31.5–35.7)
MCV: 86 fL (ref 79–97)
Monocytes Absolute: 0.8 10*3/uL (ref 0.1–0.9)
Monocytes: 9 %
Neutrophils Absolute: 5.4 10*3/uL (ref 1.4–7.0)
Neutrophils: 62 %
Platelets: 281 10*3/uL (ref 150–450)
RBC: 5.81 x10E6/uL — ABNORMAL HIGH (ref 4.14–5.80)
RDW: 13.7 % (ref 11.6–15.4)
WBC: 8.7 10*3/uL (ref 3.4–10.8)

## 2020-10-16 LAB — LIPID PANEL
Chol/HDL Ratio: 4.8 ratio (ref 0.0–5.0)
Cholesterol, Total: 181 mg/dL (ref 100–199)
HDL: 38 mg/dL — ABNORMAL LOW (ref >39)
LDL Chol Calc (NIH): 102 mg/dL — ABNORMAL HIGH (ref 0–99)
Triglycerides: 236 mg/dL — ABNORMAL HIGH (ref 0–149)
VLDL Cholesterol Cal: 41 mg/dL — ABNORMAL HIGH (ref 5–40)

## 2020-10-16 LAB — HIV ANTIBODY (ROUTINE TESTING W REFLEX): HIV Screen 4th Generation wRfx: NONREACTIVE

## 2021-01-20 ENCOUNTER — Encounter: Payer: Self-pay | Admitting: *Deleted

## 2021-04-03 ENCOUNTER — Other Ambulatory Visit: Payer: Self-pay

## 2021-04-03 ENCOUNTER — Ambulatory Visit (INDEPENDENT_AMBULATORY_CARE_PROVIDER_SITE_OTHER): Payer: BC Managed Care – PPO | Admitting: Family Medicine

## 2021-04-03 ENCOUNTER — Encounter: Payer: Self-pay | Admitting: Family Medicine

## 2021-04-03 DIAGNOSIS — E1169 Type 2 diabetes mellitus with other specified complication: Secondary | ICD-10-CM

## 2021-04-03 LAB — BAYER DCA HB A1C WAIVED: HB A1C (BAYER DCA - WAIVED): 9.8 % — ABNORMAL HIGH (ref <7.0)

## 2021-04-03 MED ORDER — EMPAGLIFLOZIN 25 MG PO TABS: 25.0000 mg | ORAL_TABLET | Freq: Every day | ORAL | 3 refills | Status: DC

## 2021-04-03 MED ORDER — METFORMIN HCL 500 MG PO TABS: 500.0000 mg | ORAL_TABLET | Freq: Two times a day (BID) | ORAL | 0 refills | Status: DC

## 2021-04-03 NOTE — Addendum Note (Signed)
Addended by: Arville Care on: 04/03/2021 02:21 PM   Modules accepted: Orders

## 2021-04-03 NOTE — Progress Notes (Signed)
 Virtual Visit via telephone Note  I connected with Jeffrey Potts on 04/03/21 at 1241 by telephone and verified that I am speaking with the correct person using two identifiers. Jeffrey Potts is currently located at home and patient are currently with her during visit. The provider, Joshua A Dettinger, MD is located in their office at time of visit.  Call ended at 1253  I discussed the limitations, risks, security and privacy concerns of performing an evaluation and management service by telephone and the availability of in person appointments. I also discussed with the patient that there may be a patient responsible charge related to this service. The patient expressed understanding and agreed to proceed.   History and Present Illness: Patient is having issues with blood sugar, they did a urine test and showed and fingerstick showed 569. 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  He has changed diet some but is drinking gatorade more because of thirst.   1. Type 2 diabetes mellitus with other specified complication, without long-term current use of insulin (HCC)     Outpatient Encounter Medications as of 04/03/2021  Medication Sig   metFORMIN (GLUCOPHAGE) 500 MG tablet Take 1 tablet (500 mg total) by mouth 2 (two) times daily with a meal.   amLODipine (NORVASC) 10 MG tablet Take 1 tablet (10 mg total) by mouth daily.   aspirin EC 81 MG EC tablet Take 1 tablet (81 mg total) by mouth daily. (Patient taking differently: Take 81 mg by mouth as needed.)   lisinopril (ZESTRIL) 40 MG tablet Take 1 tablet (40 mg total) by mouth daily.   metoprolol succinate (TOPROL-XL) 50 MG 24 hr tablet Take 1 tablet (50 mg total) by mouth daily. Take with or immediately following a meal.   naproxen sodium (ALEVE) 220 MG tablet Take 220 mg by mouth as needed. For pain   No facility-administered encounter medications on file as of 04/03/2021.    Review of Systems  Constitutional:  Negative for chills and fever.   Eyes:  Negative for discharge.  Respiratory:  Negative for shortness of breath and wheezing.   Cardiovascular:  Negative for chest pain and leg swelling.  Endocrine: Positive for polydipsia and polyuria.  Musculoskeletal:  Negative for back pain and gait problem.  Skin:  Negative for rash.  All other systems reviewed and are negative.  Observations/Objective: Patient sounds comfortable and in no acute distress  Assessment and Plan: Problem List Items Addressed This Visit   None Visit Diagnoses     Type 2 diabetes mellitus with other specified complication, without long-term current use of insulin (HCC)    -  Primary   Relevant Medications   metFORMIN (GLUCOPHAGE) 500 MG tablet   Other Relevant Orders   CMP14+EGFR   Bayer DCA Hb A1c Waived     Patient is going to come in to get A1c.  It does sound like his blood sugars are running a lot higher, it sounds like the Gatorade and Powerade's that is been drinking have been causing a lot more issues along with his soda.  Recommend to switch to diet soda and Gatorade and Powerade 0's and water.  We will see where his A1c is and see if we need more than metformin but will be start with metformin.  Follow up plan: Return in about 3 months (around 07/03/2021), or if symptoms worsen or fail to improve, for Appointment with Julie Pruitt for diabetes and 3 months with me.     I   discussed the assessment and treatment plan with the patient. The patient was provided an opportunity to ask questions and all were answered. The patient agreed with the plan and demonstrated an understanding of the instructions.   The patient was advised to call back or seek an in-person evaluation if the symptoms worsen or if the condition fails to improve as anticipated.  The above assessment and management plan was discussed with the patient. The patient verbalized understanding of and has agreed to the management plan. Patient is aware to call the clinic if symptoms  persist or worsen. Patient is aware when to return to the clinic for a follow-up visit. Patient educated on when it is appropriate to go to the emergency department.    I provided 11 minutes of non-face-to-face time during this encounter.    Joshua A Dettinger, MD    

## 2021-04-04 LAB — CMP14+EGFR
ALT: 33 IU/L (ref 0–44)
AST: 14 IU/L (ref 0–40)
Albumin/Globulin Ratio: 1.6 (ref 1.2–2.2)
Albumin: 4.3 g/dL (ref 3.8–4.9)
Alkaline Phosphatase: 120 IU/L (ref 44–121)
BUN/Creatinine Ratio: 18 (ref 9–20)
BUN: 20 mg/dL (ref 6–24)
Bilirubin Total: 0.5 mg/dL (ref 0.0–1.2)
CO2: 21 mmol/L (ref 20–29)
Calcium: 9.6 mg/dL (ref 8.7–10.2)
Chloride: 98 mmol/L (ref 96–106)
Creatinine, Ser: 1.1 mg/dL (ref 0.76–1.27)
Globulin, Total: 2.7 g/dL (ref 1.5–4.5)
Glucose: 335 mg/dL — ABNORMAL HIGH (ref 65–99)
Potassium: 4.6 mmol/L (ref 3.5–5.2)
Sodium: 135 mmol/L (ref 134–144)
Total Protein: 7 g/dL (ref 6.0–8.5)
eGFR: 81 mL/min/{1.73_m2} (ref >59)

## 2021-04-08 ENCOUNTER — Telehealth: Payer: Self-pay | Admitting: Family Medicine

## 2021-04-08 ENCOUNTER — Telehealth: Payer: Self-pay

## 2021-04-08 NOTE — Telephone Encounter (Signed)
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 He will come by the office tomorrow and pick up. Form placed up front.

## 2021-04-08 NOTE — Telephone Encounter (Signed)
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  Please advise and call patient ASAP.

## 2021-04-08 NOTE — Telephone Encounter (Signed)
Just filled out, it is in my out box now

## 2021-04-08 NOTE — Telephone Encounter (Signed)
Patient aware and verbalized understanding. °

## 2021-04-09 ENCOUNTER — Ambulatory Visit: Payer: BC Managed Care – PPO | Admitting: Family Medicine

## 2021-04-10 ENCOUNTER — Ambulatory Visit: Payer: BC Managed Care – PPO | Admitting: Family Medicine

## 2021-04-28 ENCOUNTER — Ambulatory Visit: Payer: BC Managed Care – PPO | Admitting: Pharmacist

## 2021-04-28 ENCOUNTER — Telehealth: Payer: Self-pay | Admitting: Pharmacist

## 2021-04-28 NOTE — Telephone Encounter (Signed)
Called x2  VM is not accepting messages Please have patient reschedule

## 2021-05-08 NOTE — Telephone Encounter (Signed)
No answer, mailbox full

## 2021-05-12 NOTE — Telephone Encounter (Signed)
Attempts to contact pt without return call in over 3 days, will close encounter. 

## 2021-06-03 DIAGNOSIS — R001 Bradycardia, unspecified: Secondary | ICD-10-CM | POA: Diagnosis not present

## 2021-06-03 DIAGNOSIS — I10 Essential (primary) hypertension: Secondary | ICD-10-CM | POA: Diagnosis not present

## 2021-06-03 DIAGNOSIS — E162 Hypoglycemia, unspecified: Secondary | ICD-10-CM | POA: Diagnosis not present

## 2021-06-03 DIAGNOSIS — I440 Atrioventricular block, first degree: Secondary | ICD-10-CM | POA: Diagnosis not present

## 2021-06-03 DIAGNOSIS — E161 Other hypoglycemia: Secondary | ICD-10-CM | POA: Diagnosis not present

## 2021-06-03 DIAGNOSIS — R404 Transient alteration of awareness: Secondary | ICD-10-CM | POA: Diagnosis not present

## 2021-06-03 DIAGNOSIS — R4182 Altered mental status, unspecified: Secondary | ICD-10-CM | POA: Diagnosis not present

## 2021-06-08 ENCOUNTER — Ambulatory Visit: Payer: BC Managed Care – PPO | Admitting: Family Medicine

## 2021-06-08 ENCOUNTER — Other Ambulatory Visit: Payer: Self-pay

## 2021-06-08 ENCOUNTER — Encounter: Payer: Self-pay | Admitting: Family Medicine

## 2021-06-08 VITALS — BP 148/86 | HR 78 | Ht 70.0 in | Wt 275.0 lb

## 2021-06-08 DIAGNOSIS — E785 Hyperlipidemia, unspecified: Secondary | ICD-10-CM | POA: Insufficient documentation

## 2021-06-08 DIAGNOSIS — E1169 Type 2 diabetes mellitus with other specified complication: Secondary | ICD-10-CM | POA: Diagnosis not present

## 2021-06-08 DIAGNOSIS — I10 Essential (primary) hypertension: Secondary | ICD-10-CM

## 2021-06-08 LAB — BAYER DCA HB A1C WAIVED: HB A1C (BAYER DCA - WAIVED): 7.1 % — ABNORMAL HIGH (ref 4.8–5.6)

## 2021-06-08 MED ORDER — METOPROLOL SUCCINATE ER 50 MG PO TB24: 50.0000 mg | ORAL_TABLET | Freq: Every day | ORAL | 3 refills | Status: DC

## 2021-06-08 NOTE — Progress Notes (Signed)
BP (!) 148/86   Pulse 78   Ht 5\' 10"  (1.778 m)   Wt 275 lb (124.7 kg)   SpO2 97%   BMI 39.46 kg/m    Subjective:   Patient ID: , male    DOB: [birth year], 50 y.o.   MRN: [id number]  HPI: Jeffrey Potts is a 50 y.o. male presenting on 06/08/2021 for Medical Management of Chronic Issues and Diabetes (Elevated A1C last visit. Needs better reading for DOT physical)   HPI Type 2 diabetes mellitus Patient comes in today for recheck of his diabetes. Patient has been currently taking Jardiance and metformin, A1c is better at 7.1, he does say he has low urination.. Patient is currently on an ACE inhibitor/ARB. Patient has not seen an ophthalmologist this year. Patient denies any issues with their feet. The symptom started onset as an adult hypertension and hyperlipidemia and dyslipidemia ARE RELATED TO DM   Hypertension Patient is currently on lisinopril and metoprolol, and their blood pressure today is 148/86. Patient denies any lightheadedness or dizziness. Patient denies headaches, blurred vision, chest pains, shortness of breath, or weakness. Denies any side effects from medication and is content with current medication.   Hyperlipidemia and dyslipidemia Patient is coming in for recheck of his hyperlipidemia. The patient is currently taking no medication currently, will try diet and exercise. They deny any issues with myalgias or history of liver damage from it. They deny any focal numbness or weakness or chest pain.   Relevant past medical, surgical, family and social history reviewed and updated as indicated. Interim medical history since our last visit reviewed. Allergies and medications reviewed and updated.  Review of Systems  Constitutional:  Negative for chills and fever.  Eyes:  Negative for visual disturbance.  Respiratory:  Negative for shortness of breath and wheezing.   Cardiovascular:  Negative for chest pain and leg swelling.  Genitourinary:  Positive  for frequency. Negative for dysuria and hematuria.  Musculoskeletal:  Negative for back pain and gait problem.  Skin:  Negative for rash.  Neurological:  Negative for dizziness, weakness and numbness.  All other systems reviewed and are negative.  Per HPI unless specifically indicated above   Allergies as of 06/08/2021       Reactions   Mushroom Extract Complex Shortness Of Breath   Gabapentin Rash   Penicillins Other (See Comments)   Dry Throat        Medication List        Accurate as of June 08, 2021  9:38 AM. If you have any questions, ask your nurse or doctor.          STOP taking these medications    amLODipine 10 MG tablet Commonly known as: NORVASC Stopped by: June 10, 2021 Debborah Alonge, MD       TAKE these medications    aspirin 81 MG EC tablet Take 1 tablet (81 mg total) by mouth daily. What changed:  when to take this reasons to take this   empagliflozin 25 MG Tabs tablet Commonly known as: Jardiance Take 1 tablet (25 mg total) by mouth daily before breakfast.   lisinopril 40 MG tablet Commonly known as: ZESTRIL Take 1 tablet (40 mg total) by mouth daily.   metFORMIN 500 MG tablet Commonly known as: GLUCOPHAGE Take 1 tablet (500 mg total) by mouth 2 (two) times daily with a meal.   metoprolol succinate 50 MG 24 hr tablet Commonly known as: TOPROL-XL Take 1 tablet (50 mg total) by  mouth daily. Take with or immediately following a meal.   naproxen sodium 220 MG tablet Commonly known as: ALEVE Take 220 mg by mouth as needed. For pain         Objective:   BP (!) 148/86   Pulse 78   Ht 5\' 10"  (1.778 m)   Wt 275 lb (124.7 kg)   SpO2 97%   BMI 39.46 kg/m   Wt Readings from Last 3 Encounters:  06/08/21 275 lb (124.7 kg)  10/15/20 272 lb (123.4 kg)  01/14/20 281 lb (127.5 kg)    Physical Exam Vitals and nursing note reviewed.  Constitutional:      General: He is not in acute distress.    Appearance: He is well-developed. He is not  diaphoretic.  Eyes:     General: No scleral icterus.    Conjunctiva/sclera: Conjunctivae normal.  Neck:     Thyroid: No thyromegaly.  Cardiovascular:     Rate and Rhythm: Normal rate and regular rhythm.     Heart sounds: Normal heart sounds. No murmur heard. Pulmonary:     Effort: Pulmonary effort is normal. No respiratory distress.     Breath sounds: Normal breath sounds. No wheezing.  Musculoskeletal:        General: No swelling. Normal range of motion.     Cervical back: Neck supple.  Lymphadenopathy:     Cervical: No cervical adenopathy.  Skin:    General: Skin is warm and dry.     Findings: No rash.  Neurological:     Mental Status: He is alert and oriented to person, place, and time.     Coordination: Coordination normal.  Psychiatric:        Behavior: Behavior normal.      Assessment & Plan:   Problem List Items Addressed This Visit       Cardiovascular and Mediastinum   Hypertension   Relevant Medications   metoprolol succinate (TOPROL-XL) 50 MG 24 hr tablet     Endocrine   Type 2 diabetes mellitus with other specified complication (HCC) - Primary   Relevant Orders   Bayer DCA Hb A1c Waived (Completed)     Other   Dyslipidemia    A1c today 7.1, he feels like things are doing well except for the Jardiance is making him urinate more frequently.  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 Follow up plan: Return in about 3 months (around 09/08/2021), or if symptoms worsen or fail to improve, for Hypertension and diabetes and cholesterol.  Counseling provided for all of the vaccine components Orders Placed This Encounter  Procedures   Bayer Brinson Hb A1c [place] Alexandera Kuntzman, MD [facility] 06/08/2021, 9:38 AM

## 2021-08-10 ENCOUNTER — Other Ambulatory Visit: Payer: Self-pay | Admitting: Family Medicine

## 2021-08-10 DIAGNOSIS — E1169 Type 2 diabetes mellitus with other specified complication: Secondary | ICD-10-CM

## 2021-08-17 ENCOUNTER — Ambulatory Visit: Payer: BC Managed Care – PPO | Admitting: Family Medicine

## 2021-08-17 ENCOUNTER — Encounter: Payer: Self-pay | Admitting: Family Medicine

## 2021-08-17 VITALS — BP 153/91 | HR 96 | Ht 70.0 in | Wt 276.0 lb

## 2021-08-17 DIAGNOSIS — I10 Essential (primary) hypertension: Secondary | ICD-10-CM

## 2021-08-17 DIAGNOSIS — E785 Hyperlipidemia, unspecified: Secondary | ICD-10-CM | POA: Diagnosis not present

## 2021-08-17 DIAGNOSIS — E1169 Type 2 diabetes mellitus with other specified complication: Secondary | ICD-10-CM

## 2021-08-17 DIAGNOSIS — G4730 Sleep apnea, unspecified: Secondary | ICD-10-CM

## 2021-08-17 MED ORDER — METFORMIN HCL 500 MG PO TABS: 500.0000 mg | ORAL_TABLET | Freq: Two times a day (BID) | ORAL | 3 refills | Status: DC

## 2021-08-17 MED ORDER — LISINOPRIL 40 MG PO TABS: 40.0000 mg | ORAL_TABLET | Freq: Every day | ORAL | 3 refills | Status: DC

## 2021-08-17 NOTE — Progress Notes (Signed)
BP (!) 153/91    Pulse 96    Ht 5\' 10"  (1.778 m)    Wt 276 lb (125.2 kg)    SpO2 96%    BMI 39.60 kg/m    Subjective:   Patient ID: Jeffrey Potts, male    DOB: 02-Nov-1970, 51 y.o.   MRN: [id number]  HPI: Jeffrey Potts is a 51 y.o. male presenting on 08/17/2021 for Referral for sleep study needed   HPI Patient is coming in today for sleep referral, he had a DOT physical and they are very concerned about that and wanted him to do the sleep study.  He is willing to go for sleep study and they want him to do it before clearing him for driving.  They did give him a 5-month temporary driving clearance.  He does have daytime somnolence but he lives alone and does not know if he snores or stops breathing when he snores.  He does have risk factors including a Mallampati class III-IV and age and a BMI greater than 40 and comorbid conditions including hypertension and diabetes and daytime sleepiness.  Relevant past medical, surgical, family and social history reviewed and updated as indicated. Interim medical history since our last visit reviewed. Allergies and medications reviewed and updated.  Review of Systems  Constitutional:  Negative for chills and fever.  Eyes:  Negative for visual disturbance.  Respiratory:  Negative for shortness of breath and wheezing.   Cardiovascular:  Negative for chest pain and leg swelling.  Musculoskeletal:  Negative for back pain and gait problem.  Skin:  Negative for rash.  Neurological:  Negative for dizziness, weakness and numbness.  Psychiatric/Behavioral:  Positive for sleep disturbance. Negative for dysphoric mood and suicidal ideas. The patient is not nervous/anxious.   All other systems reviewed and are negative.  Per HPI unless specifically indicated above   Allergies as of 08/17/2021       Reactions   Mushroom Extract Complex Shortness Of Breath   Gabapentin Rash   Penicillins Other (See Comments)   Dry Throat        Medication List         Accurate as of August 17, 2021  2:32 PM. If you have any questions, ask your nurse or doctor.          aspirin 81 MG EC tablet Take 1 tablet (81 mg total) by mouth daily. What changed:  when to take this reasons to take this   empagliflozin 25 MG Tabs tablet Commonly known as: Jardiance Take 1 tablet (25 mg total) by mouth daily before breakfast.   lisinopril 40 MG tablet Commonly known as: ZESTRIL Take 1 tablet (40 mg total) by mouth daily.   metFORMIN 500 MG tablet Commonly known as: GLUCOPHAGE Take 1 tablet (500 mg total) by mouth 2 (two) times daily with a meal.   metoprolol succinate 50 MG 24 hr tablet Commonly known as: TOPROL-XL Take 1 tablet (50 mg total) by mouth daily. Take with or immediately following a meal.   naproxen sodium 220 MG tablet Commonly known as: ALEVE Take 220 mg by mouth as needed. For pain         Objective:   BP (!) 153/91    Pulse 96    Ht 5\' 10"  (1.778 m)    Wt 276 lb (125.2 kg)    SpO2 96%    BMI 39.60 kg/m   Wt Readings from Last 3 Encounters:  08/17/21 276 lb (125.2 kg)  06/08/21 275 lb (124.7 kg)  10/15/20 272 lb (123.4 kg)    Physical Exam Vitals and nursing note reviewed.  Constitutional:      General: He is not in acute distress.    Appearance: He is well-developed. He is not diaphoretic.  Eyes:     General: No scleral icterus.    Conjunctiva/sclera: Conjunctivae normal.  Neck:     Thyroid: No thyromegaly.  Cardiovascular:     Rate and Rhythm: Normal rate and regular rhythm.     Heart sounds: Normal heart sounds. No murmur heard. Pulmonary:     Effort: Pulmonary effort is normal. No respiratory distress.     Breath sounds: Normal breath sounds. No wheezing.  Musculoskeletal:        General: No swelling.     Cervical back: Neck supple.  Lymphadenopathy:     Cervical: No cervical adenopathy.  Skin:    General: Skin is warm and dry.     Findings: No rash.  Neurological:     Mental Status: He is alert  and oriented to person, place, and time.     Coordination: Coordination normal.  Psychiatric:        Behavior: Behavior normal.    Results for orders placed or performed in visit on 06/08/21  Bayer DCA Hb A1c Waived  Result Value Ref Range   HB A1C (BAYER DCA - WAIVED) 7.1 (H) 4.8 - 5.6 %    Assessment & Plan:   Problem List Items Addressed This Visit       Cardiovascular and Mediastinum   Hypertension - Primary   Relevant Medications   lisinopril (ZESTRIL) 40 MG tablet     Endocrine   Type 2 diabetes mellitus with other specified complication (HCC)   Relevant Medications   lisinopril (ZESTRIL) 40 MG tablet   metFORMIN (GLUCOPHAGE) 500 MG tablet     Other   Dyslipidemia   Morbid obesity (HCC)   Relevant Medications   metFORMIN (GLUCOPHAGE) 500 MG tablet   Other Visit Diagnoses     Sleep apnea, unspecified type       Relevant Orders   Ambulatory referral to Sleep Studies       Will send for sleep study Follow up plan: Return in about 3 months (around 11/15/2021), or if symptoms worsen or fail to improve, for Diabetes.  Counseling provided for all of the vaccine components Orders Placed This Encounter  Procedures   Ambulatory referral to Sleep Studies    Caryl Pina, MD [facility] 08/17/2021, 2:32 PM

## 2021-09-01 DIAGNOSIS — G4719 Other hypersomnia: Secondary | ICD-10-CM | POA: Diagnosis not present

## 2021-09-02 DIAGNOSIS — G4719 Other hypersomnia: Secondary | ICD-10-CM | POA: Diagnosis not present

## 2021-09-10 ENCOUNTER — Ambulatory Visit: Payer: BC Managed Care – PPO | Admitting: Family Medicine

## 2021-09-10 ENCOUNTER — Encounter: Payer: Self-pay | Admitting: Family Medicine

## 2021-09-10 VITALS — BP 149/97 | HR 77 | Temp 97.8°F | Resp 20 | Ht 70.0 in | Wt 264.0 lb

## 2021-09-10 DIAGNOSIS — E785 Hyperlipidemia, unspecified: Secondary | ICD-10-CM

## 2021-09-10 DIAGNOSIS — I10 Essential (primary) hypertension: Secondary | ICD-10-CM | POA: Diagnosis not present

## 2021-09-10 DIAGNOSIS — E1169 Type 2 diabetes mellitus with other specified complication: Secondary | ICD-10-CM

## 2021-09-10 LAB — LIPID PANEL
Chol/HDL Ratio: 5.3 ratio — ABNORMAL HIGH (ref 0.0–5.0)
Cholesterol, Total: 216 mg/dL — ABNORMAL HIGH (ref 100–199)
HDL: 41 mg/dL (ref >39)
LDL Chol Calc (NIH): 146 mg/dL — ABNORMAL HIGH (ref 0–99)
Triglycerides: 162 mg/dL — ABNORMAL HIGH (ref 0–149)
VLDL Cholesterol Cal: 29 mg/dL (ref 5–40)

## 2021-09-10 LAB — CBC WITH DIFFERENTIAL/PLATELET
Basophils Absolute: 0.1 10*3/uL (ref 0.0–0.2)
Basos: 1 %
EOS (ABSOLUTE): 0.1 10*3/uL (ref 0.0–0.4)
Eos: 1 %
Hematocrit: 53.9 % — ABNORMAL HIGH (ref 37.5–51.0)
Hemoglobin: 18.2 g/dL — ABNORMAL HIGH (ref 13.0–17.7)
Immature Grans (Abs): 0.1 10*3/uL (ref 0.0–0.1)
Immature Granulocytes: 1 %
Lymphocytes Absolute: 1.6 10*3/uL (ref 0.7–3.1)
Lymphs: 14 %
MCH: 28.8 pg (ref 26.6–33.0)
MCHC: 33.8 g/dL (ref 31.5–35.7)
MCV: 85 fL (ref 79–97)
Monocytes Absolute: 0.8 10*3/uL (ref 0.1–0.9)
Monocytes: 7 %
Neutrophils Absolute: 8.6 10*3/uL — ABNORMAL HIGH (ref 1.4–7.0)
Neutrophils: 76 %
Platelets: 262 10*3/uL (ref 150–450)
RBC: 6.31 x10E6/uL — ABNORMAL HIGH (ref 4.14–5.80)
RDW: 13.8 % (ref 11.6–15.4)
WBC: 11.1 10*3/uL — ABNORMAL HIGH (ref 3.4–10.8)

## 2021-09-10 LAB — CMP14+EGFR
ALT: 36 IU/L (ref 0–44)
AST: 19 IU/L (ref 0–40)
Albumin/Globulin Ratio: 1.6 (ref 1.2–2.2)
Albumin: 4.9 g/dL (ref 3.8–4.9)
Alkaline Phosphatase: 98 IU/L (ref 44–121)
BUN/Creatinine Ratio: 24 — ABNORMAL HIGH (ref 9–20)
BUN: 28 mg/dL — ABNORMAL HIGH (ref 6–24)
Bilirubin Total: 1 mg/dL (ref 0.0–1.2)
CO2: 20 mmol/L (ref 20–29)
Calcium: 10.4 mg/dL — ABNORMAL HIGH (ref 8.7–10.2)
Chloride: 99 mmol/L (ref 96–106)
Creatinine, Ser: 1.19 mg/dL (ref 0.76–1.27)
Globulin, Total: 3 g/dL (ref 1.5–4.5)
Glucose: 77 mg/dL (ref 70–99)
Potassium: 4.5 mmol/L (ref 3.5–5.2)
Sodium: 137 mmol/L (ref 134–144)
Total Protein: 7.9 g/dL (ref 6.0–8.5)
eGFR: 73 mL/min/{1.73_m2} (ref >59)

## 2021-09-10 LAB — HM DIABETES EYE EXAM

## 2021-09-10 LAB — BAYER DCA HB A1C WAIVED: HB A1C (BAYER DCA - WAIVED): 5.5 % (ref 4.8–5.6)

## 2021-09-10 MED ORDER — AMLODIPINE BESYLATE 5 MG PO TABS: 5.0000 mg | ORAL_TABLET | Freq: Every day | ORAL | 3 refills | Status: DC

## 2021-09-10 NOTE — Progress Notes (Signed)
BP (!) 149/97    Pulse 77    Temp 97.8 F (36.6 C) (Oral)    Resp 20    Ht 5' 10"  (1.778 m)    Wt 264 lb (119.7 kg)    SpO2 94%    BMI 37.88 kg/m    Subjective:   Patient ID: Jeffrey Potts, male    DOB: 06-20-1971, 51 y.o.   MRN: [id number]  HPI: Jeffrey Potts is a 51 y.o. male presenting on 09/10/2021 for Medical Management of Chronic Issues   HPI Type 2 diabetes mellitus Patient comes in today for recheck of his diabetes. Patient has been currently taking metformin and Jardiance, A1c is 5.5, will stop Jardiance. Patient is currently on an ACE inhibitor/ARB. Patient has not seen an ophthalmologist this year. Patient denies any issues with their feet. The symptom started onset as an adult hypertension and hyperlipidemia ARE RELATED TO DM   Hypertension Patient is currently on lisinopril and metoprolol, and their blood pressure today is 149/97. Patient denies any lightheadedness or dizziness. Patient denies headaches, blurred vision, chest pains, shortness of breath, or weakness. Denies any side effects from medication and is content with current medication.   Hyperlipidemia Patient is coming in for recheck of his hyperlipidemia. The patient is currently taking none currently. They deny any issues with myalgias or history of liver damage from it. They deny any focal numbness or weakness or chest pain.   Relevant past medical, surgical, family and social history reviewed and updated as indicated. Interim medical history since our last visit reviewed. Allergies and medications reviewed and updated.  Review of Systems  Constitutional:  Negative for chills and fever.  Eyes:  Negative for visual disturbance.  Respiratory:  Negative for shortness of breath and wheezing.   Cardiovascular:  Negative for chest pain and leg swelling.  Musculoskeletal:  Negative for back pain and gait problem.  Skin:  Negative for rash.  Neurological:  Negative for dizziness, weakness and  light-headedness.  All other systems reviewed and are negative.  Per HPI unless specifically indicated above   Allergies as of 09/10/2021       Reactions   Mushroom Extract Complex Shortness Of Breath   Gabapentin Rash   Penicillins Other (See Comments)   Dry Throat        Medication List        Accurate as of September 10, 2021  9:10 AM. If you have any questions, ask your nurse or doctor.          STOP taking these medications    empagliflozin 25 MG Tabs tablet Commonly known as: Jardiance Stopped by: Fransisca Kaufmann [name], MD       TAKE these medications    amLODipine 5 MG tablet Commonly known as: NORVASC Take 1 tablet (5 mg total) by mouth daily. Started by: Worthy Rancher, MD   aspirin 81 MG EC tablet Take 1 tablet (81 mg total) by mouth daily. What changed:  when to take this reasons to take this   lisinopril 40 MG tablet Commonly known as: ZESTRIL Take 1 tablet (40 mg total) by mouth daily.   metFORMIN 500 MG tablet Commonly known as: GLUCOPHAGE Take 1 tablet (500 mg total) by mouth 2 (two) times daily with a meal.   metoprolol succinate 50 MG 24 hr tablet Commonly known as: TOPROL-XL Take 1 tablet (50 mg total) by mouth daily. Take with or immediately following a meal.   naproxen sodium 220 MG tablet  Commonly known as: ALEVE Take 220 mg by mouth as needed. For pain         Objective:   BP (!) 149/97    Pulse 77    Temp 97.8 F (36.6 C) (Oral)    Resp 20    Ht 5' 10"  (1.778 m)    Wt 264 lb (119.7 kg)    SpO2 94%    BMI 37.88 kg/m   Wt Readings from Last 3 Encounters:  09/10/21 264 lb (119.7 kg)  08/17/21 276 lb (125.2 kg)  06/08/21 275 lb (124.7 kg)    Physical Exam Vitals and nursing note reviewed.  Constitutional:      General: He is not in acute distress.    Appearance: He is well-developed. He is not diaphoretic.  Eyes:     General: No scleral icterus.    Conjunctiva/sclera: Conjunctivae normal.  Neck:     Thyroid: No  thyromegaly.  Cardiovascular:     Rate and Rhythm: Normal rate and regular rhythm.     Heart sounds: Normal heart sounds. No murmur heard. Pulmonary:     Effort: Pulmonary effort is normal. No respiratory distress.     Breath sounds: Normal breath sounds. No wheezing.  Musculoskeletal:        General: Normal range of motion.     Cervical back: Neck supple.  Lymphadenopathy:     Cervical: No cervical adenopathy.  Skin:    General: Skin is warm and dry.     Findings: No rash.  Neurological:     Mental Status: He is alert and oriented to person, place, and time.     Coordination: Coordination normal.  Psychiatric:        Behavior: Behavior normal.    A1c 5.5  Assessment & Plan:   Problem List Items Addressed This Visit       Cardiovascular and Mediastinum   Hypertension   Relevant Medications   amLODipine (NORVASC) 5 MG tablet   Other Relevant Orders   Microalbumin / creatinine urine ratio   Bayer DCA Hb A1c Waived   CBC with Differential/Platelet   CMP14+EGFR   Lipid panel     Endocrine   Type 2 diabetes mellitus with other specified complication ([place]) - Primary   Relevant Orders   Microalbumin / creatinine urine ratio   Bayer DCA Hb A1c Waived   CBC with Differential/Platelet   CMP14+EGFR   Lipid panel     Other   Dyslipidemia   Relevant Orders   Microalbumin / creatinine urine ratio   Bayer DCA Hb A1c Waived   CBC with Differential/Platelet   CMP14+EGFR   Lipid panel    Blood sugar looks good, will stop Jardiance. Blood pressure is elevated, will add amlodipine Follow up plan: Return in about 3 months (around 12/08/2021), or if symptoms worsen or fail to improve, for dm and htn and hld.  Counseling provided for all of the vaccine components Orders Placed This Encounter  Procedures   Microalbumin / creatinine urine ratio   Bayer DCA Hb A1c Waived   CBC with Differential/Platelet   CMP14+EGFR   Lipid panel    Caryl Pina, MD [facility] 09/10/2021, 9:10 AM

## 2021-09-11 LAB — MICROALBUMIN / CREATININE URINE RATIO
Creatinine, Urine: 148.3 mg/dL
Microalb/Creat Ratio: 37 mg/g creat — ABNORMAL HIGH (ref 0–29)
Microalbumin, Urine: 54.2 ug/mL

## 2021-09-25 ENCOUNTER — Encounter: Payer: Self-pay | Admitting: *Deleted

## 2021-12-10 ENCOUNTER — Encounter: Payer: Self-pay | Admitting: Family Medicine

## 2021-12-10 ENCOUNTER — Ambulatory Visit: Payer: BC Managed Care – PPO | Admitting: Family Medicine

## 2022-06-15 ENCOUNTER — Other Ambulatory Visit: Payer: Self-pay | Admitting: Family Medicine

## 2022-06-15 DIAGNOSIS — I10 Essential (primary) hypertension: Secondary | ICD-10-CM

## 2023-03-12 ENCOUNTER — Other Ambulatory Visit: Payer: Self-pay

## 2023-03-12 ENCOUNTER — Emergency Department (HOSPITAL_COMMUNITY)
Admission: EM | Admit: 2023-03-12 | Discharge: 2023-03-13 | Disposition: A | Discharge status: Home / Self Care | Payer: BC Managed Care – PPO | Attending: Emergency Medicine | Admitting: Emergency Medicine

## 2023-03-12 DIAGNOSIS — Z7982 Long term (current) use of aspirin: Secondary | ICD-10-CM | POA: Insufficient documentation

## 2023-03-12 DIAGNOSIS — Z79899 Other long term (current) drug therapy: Secondary | ICD-10-CM | POA: Insufficient documentation

## 2023-03-12 DIAGNOSIS — Z7984 Long term (current) use of oral hypoglycemic drugs: Secondary | ICD-10-CM | POA: Insufficient documentation

## 2023-03-12 DIAGNOSIS — I10 Essential (primary) hypertension: Secondary | ICD-10-CM | POA: Insufficient documentation

## 2023-03-12 DIAGNOSIS — E1165 Type 2 diabetes mellitus with hyperglycemia: Secondary | ICD-10-CM | POA: Insufficient documentation

## 2023-03-12 DIAGNOSIS — E860 Dehydration: Secondary | ICD-10-CM | POA: Insufficient documentation

## 2023-03-12 DIAGNOSIS — R739 Hyperglycemia, unspecified: Secondary | ICD-10-CM

## 2023-03-12 LAB — COMPREHENSIVE METABOLIC PANEL
ALT: 51 U/L — ABNORMAL HIGH (ref 0–44)
AST: 23 U/L (ref 15–41)
Albumin: 4 g/dL (ref 3.5–5.0)
Alkaline Phosphatase: 121 U/L (ref 38–126)
Anion gap: 13 (ref 5–15)
BUN: 17 mg/dL (ref 6–20)
CO2: 19 mmol/L — ABNORMAL LOW (ref 22–32)
Calcium: 9.3 mg/dL (ref 8.9–10.3)
Chloride: 98 mmol/L (ref 98–111)
Creatinine, Ser: 1 mg/dL (ref 0.61–1.24)
GFR, Estimated: >60 mL/min (ref >60)
Glucose, Bld: 427 mg/dL — ABNORMAL HIGH (ref 70–99)
Potassium: 3.3 mmol/L — ABNORMAL LOW (ref 3.5–5.1)
Sodium: 130 mmol/L — ABNORMAL LOW (ref 135–145)
Total Bilirubin: 1.1 mg/dL (ref 0.3–1.2)
Total Protein: 7.6 g/dL (ref 6.5–8.1)

## 2023-03-12 LAB — URINALYSIS, ROUTINE W REFLEX MICROSCOPIC
Bacteria, UA: NONE SEEN
Bilirubin Urine: NEGATIVE
Glucose, UA: >=500 mg/dL — AB
Ketones, ur: 5 mg/dL — AB
Leukocytes,Ua: NEGATIVE
Nitrite: NEGATIVE
Protein, ur: 30 mg/dL — AB
Specific Gravity, Urine: 1.031 — ABNORMAL HIGH (ref 1.005–1.030)
pH: 5 (ref 5.0–8.0)

## 2023-03-12 LAB — CBC
HCT: 48.8 % (ref 39.0–52.0)
Hemoglobin: 17.4 g/dL — ABNORMAL HIGH (ref 13.0–17.0)
MCH: 29.6 pg (ref 26.0–34.0)
MCHC: 35.7 g/dL (ref 30.0–36.0)
MCV: 83.1 fL (ref 80.0–100.0)
Platelets: 234 10*3/uL (ref 150–400)
RBC: 5.87 MIL/uL — ABNORMAL HIGH (ref 4.22–5.81)
RDW: 13.5 % (ref 11.5–15.5)
WBC: 8.9 10*3/uL (ref 4.0–10.5)
nRBC: 0 % (ref 0.0–0.2)

## 2023-03-12 LAB — CBG MONITORING, ED: Glucose-Capillary: 406 mg/dL — ABNORMAL HIGH (ref 70–99)

## 2023-03-12 MED ORDER — SODIUM CHLORIDE 0.9 % IV BOLUS
2000.0000 mL | Freq: Once | INTRAVENOUS | Status: AC
  Administered 2023-03-13: 2000 mL via INTRAVENOUS

## 2023-03-12 NOTE — ED Triage Notes (Addendum)
Pt states feels like he is dehydrated. Drinking a lot and Frequent urination. Pt was taken off metformin 1 year ago. Pt has been out of BP medication x 1 month and can't get it until he sees PCP in 1 month.

## 2023-03-13 LAB — CBG MONITORING, ED: Glucose-Capillary: 308 mg/dL — ABNORMAL HIGH (ref 70–99)

## 2023-03-13 MED ORDER — METOPROLOL TARTRATE 25 MG PO TABS
25.0000 mg | ORAL_TABLET | Freq: Once | ORAL | Status: AC
  Administered 2023-03-13: 25 mg via ORAL
  Filled 2023-03-13: qty 1

## 2023-03-13 MED ORDER — AMLODIPINE BESYLATE 5 MG PO TABS: 5.0000 mg | ORAL_TABLET | Freq: Every day | ORAL | 0 refills | Status: DC

## 2023-03-13 MED ORDER — LISINOPRIL 40 MG PO TABS: 40.0000 mg | ORAL_TABLET | Freq: Every day | ORAL | 0 refills | Status: DC

## 2023-03-13 MED ORDER — METFORMIN HCL 500 MG PO TABS: 500.0000 mg | ORAL_TABLET | Freq: Two times a day (BID) | ORAL | 0 refills | Status: DC

## 2023-03-13 MED ORDER — LISINOPRIL 10 MG PO TABS
40.0000 mg | ORAL_TABLET | Freq: Once | ORAL | Status: AC
  Administered 2023-03-13: 40 mg via ORAL
  Filled 2023-03-13: qty 4

## 2023-03-13 MED ORDER — AMLODIPINE BESYLATE 5 MG PO TABS
5.0000 mg | ORAL_TABLET | Freq: Once | ORAL | Status: AC
  Administered 2023-03-13: 5 mg via ORAL
  Filled 2023-03-13: qty 1

## 2023-03-13 MED ORDER — METOPROLOL SUCCINATE ER 25 MG PO TB24: 25.0000 mg | ORAL_TABLET | Freq: Every day | ORAL | 0 refills | Status: DC

## 2023-03-13 NOTE — ED Provider Notes (Signed)
EMERGENCY DEPARTMENT AT [facility] Provider Note   CSN: [id number] Arrival date & time: 03/12/23  2215     History  Chief Complaint  Patient presents with   Urinary Frequency   Hyperglycemia   Hypertension    Jeffrey Potts is a 52 y.o. male.  The history is provided by the patient.   Patient w/history of diabetes, hypertension presents with multiple complaints.  Patient reports he was taken off metformin a year ago.  Patient reports he ran out of his blood pressure medicine a month ago.  He reports he is unable to see his PCP until September.  He reports for the past week he has been having polyuria and polydipsia. No fevers or vomiting.  No chest pain or shortness of breath.  He reports mild nausea.  He reports feeling very dehydrated.    Past Medical History:  Diagnosis Date   Hypertension    Hypertensive urgency 01/25/2015    Home Medications Prior to Admission medications   Medication Sig Start Date End Date Taking? Authorizing Provider  amLODipine (NORVASC) 5 MG tablet Take 1 tablet (5 mg total) by mouth daily. 03/13/23  Yes Zadie Rhine, MD  lisinopril (ZESTRIL) 40 MG tablet Take 1 tablet (40 mg total) by mouth daily. 03/13/23  Yes Zadie Rhine, MD  metoprolol succinate (TOPROL XL) 25 MG 24 hr tablet Take 1 tablet (25 mg total) by mouth daily. 03/13/23  Yes Zadie Rhine, MD  aspirin EC 81 MG EC tablet Take 1 tablet (81 mg total) by mouth daily. Patient taking differently: Take 81 mg by mouth as needed. 01/26/15   Elliot Cousin, MD  metFORMIN (GLUCOPHAGE) 500 MG tablet Take 1 tablet (500 mg total) by mouth 2 (two) times daily with a meal. 03/13/23   Zadie Rhine, MD  naproxen sodium (ALEVE) 220 MG tablet Take 220 mg by mouth as needed. For pain    [provider]      Allergies    Mushroom extract complex, Gabapentin, and Penicillins    Review of Systems   Review of Systems  Constitutional:  Positive for fatigue.  Negative for fever.  Respiratory:  Negative for shortness of breath.   Cardiovascular:  Negative for chest pain.  Gastrointestinal:  Negative for abdominal pain, constipation, diarrhea and vomiting.  Endocrine: Positive for polydipsia and polyuria.    Physical Exam Updated Vital Signs BP (!) 187/104   Pulse 76   Temp 99.5 F (37.5 C) (Oral)   Resp 15   Ht 1.778 m (5\' 10" )   Wt 108.9 kg   SpO2 97%   BMI 34.44 kg/m  Physical Exam CONSTITUTIONAL: Well developed/well nourished HEAD: Normocephalic/atraumatic EYES: EOMI/PERRL ENMT: Mucous membranes dry NECK: supple no meningeal signs SPINE/BACK:entire spine nontender CV: S1/S2 noted, no murmurs/rubs/gallops noted LUNGS: Lungs are clear to auscultation bilaterally, no apparent distress ABDOMEN: soft, nontender, no rebound or guarding, bowel sounds noted throughout abdomen GU:no cva tenderness NEURO: Pt is awake/alert/appropriate, moves all extremitiesx4.  No facial droop.  No arm or leg drift. EXTREMITIES: pulses normal/equal, full ROM SKIN: warm, color normal, no wounds are noted to his feet PSYCH: no abnormalities of mood noted, alert and oriented to situation  ED Results / Procedures / Treatments   Labs (all labs ordered are listed, but only abnormal results are displayed) Labs Reviewed  CBC - Abnormal; Notable for the following components:      Result Value   RBC 5.87 (*)    Hemoglobin 17.4 (*)  All other components within normal limits  URINALYSIS, ROUTINE W REFLEX MICROSCOPIC - Abnormal; Notable for the following components:   Color, Urine STRAW (*)    Specific Gravity, Urine 1.031 (*)    Glucose, UA >=500 (*)    Hgb urine dipstick SMALL (*)    Ketones, ur 5 (*)    Protein, ur 30 (*)    All other components within normal limits  COMPREHENSIVE METABOLIC PANEL - Abnormal; Notable for the following components:   Sodium 130 (*)    Potassium 3.3 (*)    CO2 19 (*)    Glucose, Bld 427 (*)    ALT 51 (*)    All other  components within normal limits  CBG MONITORING, ED - Abnormal; Notable for the following components:   Glucose-Capillary 406 (*)    All other components within normal limits  CBG MONITORING, ED - Abnormal; Notable for the following components:   Glucose-Capillary 308 (*)    All other components within normal limits    EKG EKG Interpretation Date/Time:  Sunday March 13 2023 00:10:27 EDT Ventricular Rate:  82 PR Interval:  202 QRS Duration:  104 QT Interval:  381 QTC Calculation: 445 R Axis:   102  Text Interpretation: Sinus rhythm Borderline prolonged PR interval Left posterior fascicular block Probable anteroseptal infarct, old No significant change since last tracing Confirmed by Zadie Rhine ([id number]) on 03/13/2023 12:24:48 AM  Radiology No results found.  Procedures Procedures    Medications Ordered in ED Medications  sodium chloride 0.9 % bolus 2,000 mL (2,000 mLs Intravenous New Bag/Given 03/13/23 0003)  amLODipine (NORVASC) tablet 5 mg (5 mg Oral Given 03/13/23 0045)  lisinopril (ZESTRIL) tablet 40 mg (40 mg Oral Given 03/13/23 0044)  metoprolol tartrate (LOPRESSOR) tablet 25 mg (25 mg Oral Given 03/13/23 0045)    ED Course/ Medical Decision Making/ A&P Clinical Course as of 03/13/23 0135  Sun Mar 13, 2023  0033 Glucose(!): 427 Hyperglycemia without anion gap [DW]  0033 Dehydration noted [DW]  0038 Patient has been nonadherent to his medication regimen due to lack of prescription access and difficulty seeing his PCP  He is hypertensive and hyperglycemic.  However he has no signs of acute CVA or other hypertensive emergency.  No signs of DKA at this time  Will give IV fluids and start his home medications. [DW]  0129 Patient is improved.  Glucose is improving.  Blood pressure is improving. Plan for discharge home.  Will start his home medications. [DW]    Clinical Course User Index [DW] Zadie Rhine, MD                                 Medical Decision  Making Amount and/or Complexity of Data Reviewed Labs: ordered. Decision-making details documented in ED Course. ECG/medicine tests: ordered.  Risk Prescription drug management.   This patient presents to the ED for concern of hyperglycemia, this involves an extensive number of treatment options, and is a complaint that carries with it a high risk of complications and morbidity.  The differential diagnosis includes but is not limited to diabetic ketoacidosis, hyperglycemia, hyperosmolar hyperglycemic state  Comorbidities that complicate the patient evaluation: Patient's presentation is complicated by their history of hypertension, diabetes  Social Determinants of Health: Patient's lack of prescription access and impaired access to primary care  increases the complexity of managing their presentation  Additional history obtained: Records reviewed Primary Care Documents  Lab  Tests: I Ordered, and personally interpreted labs.  The pertinent results include: Hyperglycemia without anion gap, dehydration   Cardiac Monitoring: The patient was maintained on a cardiac monitor.  I personally viewed and interpreted the cardiac monitor which showed an underlying rhythm of:  sinus rhythm  Medicines ordered and prescription drug management: I ordered medication including IV fluids for hydration Reevaluation of the patient after these medicines showed that the patient    improved  Critical Interventions:   IV fluids  Reevaluation: After the interventions noted above, I reevaluated the patient and found that they have :improved  Complexity of problems addressed: Patient's presentation is most consistent with  acute presentation with potential threat to life or bodily function  Disposition: After consideration of the diagnostic results and the patient's response to treatment,  I feel that the patent would benefit from discharge   .           Final Clinical Impression(s) / ED  Diagnoses Final diagnoses:  Hyperglycemia  Primary hypertension  Dehydration    Rx / DC Orders ED Discharge Orders          Ordered    metFORMIN (GLUCOPHAGE) 500 MG tablet  2 times daily with meals,   Status:  Discontinued        03/13/23 0049    lisinopril (ZESTRIL) 40 MG tablet  Daily        03/13/23 0049    amLODipine (NORVASC) 5 MG tablet  Daily        03/13/23 0049    metoprolol succinate (TOPROL XL) 25 MG 24 hr tablet  Daily        03/13/23 0049    metFORMIN (GLUCOPHAGE) 500 MG tablet  2 times daily with meals        03/13/23 0049              Zadie Rhine, MD 03/13/23 [phone]

## 2023-03-13 NOTE — ED Notes (Signed)
Pt reported "I have been peeing a lot and very dehydrated." Denied N/V, dizziness, headache, and blurred vision Pt stated symptoms started "days" ago

## 2023-03-14 ENCOUNTER — Telehealth: Payer: Self-pay

## 2023-03-14 NOTE — Transitions of Care (Post Inpatient/ED Visit) (Signed)
   03/14/2023  Name: Jeffrey Potts MRN: [id number] DOB: June 08, 1971  Today's TOC FU Call Status: Today's TOC FU Call Status:: Unsuccessful Call (1st Attempt) Unsuccessful Call (1st Attempt) Date: 03/14/23  Attempted to reach the patient regarding the most recent Inpatient/ED visit.  Follow Up Plan: Additional outreach attempts will be made to reach the patient to complete the Transitions of Care (Post Inpatient/ED visit) call.   Signature Karena Addison, LPN [facility] Nurse Health Advisor Direct Dial [phone]

## 2023-03-17 ENCOUNTER — Other Ambulatory Visit: Payer: Self-pay

## 2023-03-17 ENCOUNTER — Encounter (HOSPITAL_COMMUNITY): Payer: Self-pay

## 2023-03-17 ENCOUNTER — Emergency Department (HOSPITAL_COMMUNITY)
Admission: EM | Admit: 2023-03-17 | Discharge: 2023-03-18 | Disposition: A | Discharge status: Home / Self Care | Payer: Self-pay | Attending: Emergency Medicine | Admitting: Emergency Medicine

## 2023-03-17 DIAGNOSIS — E1165 Type 2 diabetes mellitus with hyperglycemia: Secondary | ICD-10-CM | POA: Insufficient documentation

## 2023-03-17 DIAGNOSIS — R000 Tachycardia, unspecified: Secondary | ICD-10-CM | POA: Insufficient documentation

## 2023-03-17 DIAGNOSIS — Z7984 Long term (current) use of oral hypoglycemic drugs: Secondary | ICD-10-CM | POA: Insufficient documentation

## 2023-03-17 DIAGNOSIS — R739 Hyperglycemia, unspecified: Secondary | ICD-10-CM

## 2023-03-17 DIAGNOSIS — Z7982 Long term (current) use of aspirin: Secondary | ICD-10-CM | POA: Insufficient documentation

## 2023-03-17 LAB — URINALYSIS, ROUTINE W REFLEX MICROSCOPIC
Bacteria, UA: NONE SEEN
Bilirubin Urine: NEGATIVE
Glucose, UA: >=500 mg/dL — AB
Ketones, ur: 20 mg/dL — AB
Leukocytes,Ua: NEGATIVE
Nitrite: NEGATIVE
Protein, ur: NEGATIVE mg/dL
Specific Gravity, Urine: 1.028 (ref 1.005–1.030)
pH: 5 (ref 5.0–8.0)

## 2023-03-17 LAB — BLOOD GAS, VENOUS
Acid-base deficit: 3.8 mmol/L — ABNORMAL HIGH (ref 0.0–2.0)
Bicarbonate: 20.8 mmol/L (ref 20.0–28.0)
Drawn by: 1528
O2 Saturation: 73.6 %
Patient temperature: 36.5
pCO2, Ven: 35 mmHg — ABNORMAL LOW (ref 44–60)
pH, Ven: 7.38 (ref 7.25–7.43)
pO2, Ven: 37 mmHg (ref 32–45)

## 2023-03-17 LAB — CBC WITH DIFFERENTIAL/PLATELET
Abs Immature Granulocytes: 0.06 10*3/uL (ref 0.00–0.07)
Basophils Absolute: 0.1 10*3/uL (ref 0.0–0.1)
Basophils Relative: 1 %
Eosinophils Absolute: 0.3 10*3/uL (ref 0.0–0.5)
Eosinophils Relative: 3 %
HCT: 48.4 % (ref 39.0–52.0)
Hemoglobin: 16.9 g/dL (ref 13.0–17.0)
Immature Granulocytes: 1 %
Lymphocytes Relative: 14 %
Lymphs Abs: 1.6 10*3/uL (ref 0.7–4.0)
MCH: 29.3 pg (ref 26.0–34.0)
MCHC: 34.9 g/dL (ref 30.0–36.0)
MCV: 84 fL (ref 80.0–100.0)
Monocytes Absolute: 0.8 10*3/uL (ref 0.1–1.0)
Monocytes Relative: 7 %
Neutro Abs: 8.9 10*3/uL — ABNORMAL HIGH (ref 1.7–7.7)
Neutrophils Relative %: 74 %
Platelets: 241 10*3/uL (ref 150–400)
RBC: 5.76 MIL/uL (ref 4.22–5.81)
RDW: 13.3 % (ref 11.5–15.5)
WBC: 11.8 10*3/uL — ABNORMAL HIGH (ref 4.0–10.5)
nRBC: 0 % (ref 0.0–0.2)

## 2023-03-17 LAB — COMPREHENSIVE METABOLIC PANEL
ALT: 39 U/L (ref 0–44)
AST: 16 U/L (ref 15–41)
Albumin: 4.1 g/dL (ref 3.5–5.0)
Alkaline Phosphatase: 127 U/L — ABNORMAL HIGH (ref 38–126)
Anion gap: 14 (ref 5–15)
BUN: 18 mg/dL (ref 6–20)
CO2: 19 mmol/L — ABNORMAL LOW (ref 22–32)
Calcium: 9.4 mg/dL (ref 8.9–10.3)
Chloride: 100 mmol/L (ref 98–111)
Creatinine, Ser: 1.1 mg/dL (ref 0.61–1.24)
GFR, Estimated: >60 mL/min (ref >60)
Glucose, Bld: 445 mg/dL — ABNORMAL HIGH (ref 70–99)
Potassium: 3.8 mmol/L (ref 3.5–5.1)
Sodium: 133 mmol/L — ABNORMAL LOW (ref 135–145)
Total Bilirubin: 1.5 mg/dL — ABNORMAL HIGH (ref 0.3–1.2)
Total Protein: 7.4 g/dL (ref 6.5–8.1)

## 2023-03-17 LAB — CBG MONITORING, ED: Glucose-Capillary: 468 mg/dL — ABNORMAL HIGH (ref 70–99)

## 2023-03-17 MED ORDER — LACTATED RINGERS IV BOLUS
2000.0000 mL | Freq: Once | INTRAVENOUS | Status: AC
  Administered 2023-03-17: 2000 mL via INTRAVENOUS

## 2023-03-17 NOTE — ED Triage Notes (Addendum)
Pt c/o feelings of dehydration. States that he is dizzy, very thirsty and mouth is dry. Seen here a few days ago for same symptoms. Also states that he has frequent urination.

## 2023-03-17 NOTE — ED Notes (Signed)
ED Provider at bedside. 

## 2023-03-18 LAB — CBG MONITORING, ED
Glucose-Capillary: 303 mg/dL — ABNORMAL HIGH (ref 70–99)
Glucose-Capillary: 219 mg/dL — ABNORMAL HIGH (ref 70–99)

## 2023-03-18 MED ORDER — LACTATED RINGERS IV BOLUS
500.0000 mL | Freq: Once | INTRAVENOUS | Status: AC
  Administered 2023-03-18: 500 mL via INTRAVENOUS

## 2023-03-18 MED ORDER — LACTATED RINGERS IV BOLUS
1000.0000 mL | Freq: Once | INTRAVENOUS | Status: AC
  Administered 2023-03-18: 1000 mL via INTRAVENOUS

## 2023-03-18 MED ORDER — INSULIN ASPART 100 UNIT/ML IJ SOLN
5.0000 [IU] | Freq: Once | INTRAMUSCULAR | Status: AC
  Administered 2023-03-18: 5 [IU] via INTRAVENOUS
  Filled 2023-03-18: qty 1

## 2023-03-18 MED ORDER — METFORMIN HCL 500 MG PO TABS
500.0000 mg | ORAL_TABLET | Freq: Once | ORAL | Status: AC
  Administered 2023-03-18: 500 mg via ORAL
  Filled 2023-03-18: qty 1

## 2023-03-18 MED ORDER — METFORMIN HCL 500 MG PO TABS
1000.0000 mg | ORAL_TABLET | Freq: Two times a day (BID) | ORAL | 0 refills | Status: DC
Start: 2023-03-18 — End: 2024-04-07

## 2023-03-18 NOTE — ED Provider Notes (Signed)
EMERGENCY DEPARTMENT AT [facility] Provider Note   CSN: [id number] Arrival date & time: 03/17/23  2220     History  Chief Complaint  Patient presents with   Dizziness   Dry Mouth    Jeffrey Potts is a 52 y.o. male.  52 year old male who presents ER today with recurrent frequent urination, dry mouth and increased thirst.  He was here few days ago and was hyperglycemic without DKA.  Was restarted on his metformin which had been discontinued about a year ago.  Was also restarted on his blood pressure medication.  Patient has a medicines with him and states compliance.   Dizziness      Home Medications Prior to Admission medications   Medication Sig Start Date End Date Taking? Authorizing Provider  amLODipine (NORVASC) 5 MG tablet Take 1 tablet (5 mg total) by mouth daily. 03/13/23   Zadie Rhine, MD  aspirin EC 81 MG EC tablet Take 1 tablet (81 mg total) by mouth daily. Patient taking differently: Take 81 mg by mouth as needed. 01/26/15   Elliot Cousin, MD  lisinopril (ZESTRIL) 40 MG tablet Take 1 tablet (40 mg total) by mouth daily. 03/13/23   Zadie Rhine, MD  metFORMIN (GLUCOPHAGE) 500 MG tablet Take 2 tablets (1,000 mg total) by mouth 2 (two) times daily with a meal. 03/18/23 04/17/23  Zadaya Cuadra, Barbara Cower, MD  metoprolol succinate (TOPROL XL) 25 MG 24 hr tablet Take 1 tablet (25 mg total) by mouth daily. 03/13/23   Zadie Rhine, MD  naproxen sodium (ALEVE) 220 MG tablet Take 220 mg by mouth as needed. For pain    [provider]      Allergies    Mushroom extract complex, Gabapentin, and Penicillins    Review of Systems   Review of Systems  Neurological:  Positive for dizziness.    Physical Exam Updated Vital Signs BP (!) 147/98   Pulse 75   Temp 97.8 F (36.6 C) (Oral)   Resp 17   SpO2 96%  Physical Exam Vitals and nursing note reviewed.  Constitutional:      Appearance: He is well-developed.  HENT:     Head:  Normocephalic and atraumatic.  Eyes:     Pupils: Pupils are equal, round, and reactive to light.  Cardiovascular:     Rate and Rhythm: Tachycardia present.  Pulmonary:     Effort: Pulmonary effort is normal. No respiratory distress.  Abdominal:     General: There is no distension.  Musculoskeletal:        General: Normal range of motion.     Cervical back: Normal range of motion.  Skin:    General: Skin is warm and dry.  Neurological:     General: No focal deficit present.     Mental Status: He is alert.     ED Results / Procedures / Treatments   Labs (all labs ordered are listed, but only abnormal results are displayed) Labs Reviewed  CBC WITH DIFFERENTIAL/PLATELET - Abnormal; Notable for the following components:      Result Value   WBC 11.8 (*)    Neutro Abs 8.9 (*)    All other components within normal limits  COMPREHENSIVE METABOLIC PANEL - Abnormal; Notable for the following components:   Sodium 133 (*)    CO2 19 (*)    Glucose, Bld 445 (*)    Alkaline Phosphatase 127 (*)    Total Bilirubin 1.5 (*)    All other components within normal  limits  BLOOD GAS, VENOUS - Abnormal; Notable for the following components:   pCO2, Ven 35 (*)    Acid-base deficit 3.8 (*)    All other components within normal limits  URINALYSIS, ROUTINE W REFLEX MICROSCOPIC - Abnormal; Notable for the following components:   Color, Urine STRAW (*)    Glucose, UA >=500 (*)    Hgb urine dipstick SMALL (*)    Ketones, ur 20 (*)    All other components within normal limits  CBG MONITORING, ED - Abnormal; Notable for the following components:   Glucose-Capillary 468 (*)    All other components within normal limits  CBG MONITORING, ED - Abnormal; Notable for the following components:   Glucose-Capillary 303 (*)    All other components within normal limits  CBG MONITORING, ED - Abnormal; Notable for the following components:   Glucose-Capillary 219 (*)    All other components within normal  limits    EKG EKG Interpretation Date/Time:  Thursday March 17 2023 23:39:06 EDT Ventricular Rate:  95 PR Interval:  208 QRS Duration:  92 QT Interval:  339 QTC Calculation: 427 R Axis:   100  Text Interpretation: Sinus rhythm Prolonged PR interval Right axis deviation Nonspecific T abnormalities, inferior leads When compared with ECG of EARLIER SAME DATE No significant change was found Confirmed by Dione Booze ([id number]) on 03/18/2023 5:20:02 AM  Radiology No results found.  Procedures Procedures    Medications Ordered in ED Medications  lactated ringers bolus 2,000 mL (0 mLs Intravenous Stopped 03/18/23 0256)  insulin aspart (novoLOG) injection 5 Units (5 Units Intravenous Given 03/18/23 0020)  insulin aspart (novoLOG) injection 5 Units (5 Units Intravenous Given 03/18/23 0254)  lactated ringers bolus 1,000 mL (0 mLs Intravenous Stopped 03/18/23 0452)  metFORMIN (GLUCOPHAGE) tablet 500 mg (500 mg Oral Given 03/18/23 0254)  lactated ringers bolus 500 mL (500 mLs Intravenous New Bag/Given 03/18/23 0452)    ED Course/ Medical Decision Making/ A&P                                 Medical Decision Making Amount and/or Complexity of Data Reviewed Labs: ordered. ECG/medicine tests: ordered.  Risk Prescription drug management.   CBG is high again without DKA.  Normal mental status no concern for HHS.  Will work on improving his blood sugar and probably increase his dose of metformin no indication for new agents at this time but needs a PCP desperately to manage his diabetes and hypertension.  Blood sugar improved significantly. Symptoms improved significantly. TOC consult to ensure he gets PCP follow up. Will increase Metformin to 1000 BID for now until he can see a PCP.    Final Clinical Impression(s) / ED Diagnoses Final diagnoses:  Hyperglycemia    Rx / DC Orders ED Discharge Orders          Ordered    metFORMIN (GLUCOPHAGE) 500 MG tablet  2 times daily with meals         03/18/23 0518              Masao Junker, Barbara Cower, MD 03/18/23 0532

## 2023-03-18 NOTE — ED Notes (Signed)
Ice chips given to pt.  

## 2023-03-23 NOTE — Transitions of Care (Post Inpatient/ED Visit) (Unsigned)
   03/23/2023  Name: Jeffrey Potts MRN: [id number] DOB: 12-Jan-1971  Today's TOC FU Call Status: Today's TOC FU Call Status:: Unsuccessful Call (2nd Attempt) Unsuccessful Call (1st Attempt) Date: 03/14/23 Unsuccessful Call (2nd Attempt) Date: 03/23/23  Attempted to reach the patient regarding the most recent Inpatient/ED visit.  Follow Up Plan: Additional outreach attempts will be made to reach the patient to complete the Transitions of Care (Post Inpatient/ED visit) call.   Signature Karena Addison, LPN [facility] Nurse Health Advisor Direct Dial [phone]

## 2023-03-24 NOTE — Transitions of Care (Post Inpatient/ED Visit) (Signed)
   03/24/2023  Name: LENARDO GUNTRUM MRN: [id number] DOB: 01-May-1971  Today's TOC FU Call Status: Today's TOC FU Call Status:: Unsuccessful Call (3rd Attempt) Unsuccessful Call (1st Attempt) Date: 03/14/23 Unsuccessful Call (2nd Attempt) Date: 03/23/23 Unsuccessful Call (3rd Attempt) Date: 03/24/23  Attempted to reach the patient regarding the most recent Inpatient/ED visit.  Follow Up Plan: No further outreach attempts will be made at this time. We have been unable to contact the patient.  Signature Karena Addison, LPN [facility] Nurse Health Advisor Direct Dial [phone]

## 2023-03-29 ENCOUNTER — Ambulatory Visit: Payer: Self-pay | Admitting: Nurse Practitioner

## 2023-03-29 NOTE — Progress Notes (Deleted)
Subjective:    Patient ID: Jeffrey Potts, male    DOB: 1970/08/22, 52 y.o.   MRN: [id number]   Chief Complaint: hospital follow up  HPI Patient went to the ED on 03/17/23 c/o frequent urination dry mouth and increase thirst. Blood sugar was elevated at home. He was dx with hyperglycemia without DKA and hypertension. He was on metformin 500 BID for diabetes and lisinopri 40mg  , norvasc 5mg  and toprol XL 25mg  for hypertension.  Last blood sugar was 219 The ED increased his metformin to 1000mg  BID but no changes were made to his blood pressure meds. BP Readings from Last 3 Encounters:  03/18/23 (!) 167/101  03/13/23 (!) 177/112  09/10/21 (!) 149/97    Patient Active Problem List   Diagnosis Date Noted   Dyslipidemia 06/08/2021   Type 2 diabetes mellitus with other specified complication (HCC) 10/15/2020   Hypertension 01/15/2019   Morbid obesity (HCC) 01/25/2015       Review of Systems     Objective:   Physical Exam        Assessment & Plan:

## 2023-03-30 ENCOUNTER — Other Ambulatory Visit: Payer: Self-pay

## 2023-03-30 ENCOUNTER — Emergency Department (HOSPITAL_COMMUNITY)
Admission: EM | Admit: 2023-03-30 | Discharge: 2023-03-30 | Disposition: A | Discharge status: Home / Self Care | Payer: Self-pay | Attending: Emergency Medicine | Admitting: Emergency Medicine

## 2023-03-30 ENCOUNTER — Encounter: Payer: Self-pay | Admitting: Family Medicine

## 2023-03-30 DIAGNOSIS — Z7982 Long term (current) use of aspirin: Secondary | ICD-10-CM | POA: Insufficient documentation

## 2023-03-30 DIAGNOSIS — Z7984 Long term (current) use of oral hypoglycemic drugs: Secondary | ICD-10-CM | POA: Insufficient documentation

## 2023-03-30 DIAGNOSIS — Z79899 Other long term (current) drug therapy: Secondary | ICD-10-CM | POA: Insufficient documentation

## 2023-03-30 DIAGNOSIS — R739 Hyperglycemia, unspecified: Secondary | ICD-10-CM

## 2023-03-30 DIAGNOSIS — I10 Essential (primary) hypertension: Secondary | ICD-10-CM | POA: Insufficient documentation

## 2023-03-30 DIAGNOSIS — E1165 Type 2 diabetes mellitus with hyperglycemia: Secondary | ICD-10-CM | POA: Insufficient documentation

## 2023-03-30 DIAGNOSIS — N179 Acute kidney failure, unspecified: Secondary | ICD-10-CM | POA: Insufficient documentation

## 2023-03-30 LAB — CBC
HCT: 46.2 % (ref 39.0–52.0)
Hemoglobin: 15.8 g/dL (ref 13.0–17.0)
MCH: 29.4 pg (ref 26.0–34.0)
MCHC: 34.2 g/dL (ref 30.0–36.0)
MCV: 85.9 fL (ref 80.0–100.0)
Platelets: 257 10*3/uL (ref 150–400)
RBC: 5.38 MIL/uL (ref 4.22–5.81)
RDW: 13.2 % (ref 11.5–15.5)
WBC: 9 10*3/uL (ref 4.0–10.5)
nRBC: 0 % (ref 0.0–0.2)

## 2023-03-30 LAB — COMPREHENSIVE METABOLIC PANEL
ALT: 42 U/L (ref 0–44)
AST: 17 U/L (ref 15–41)
Albumin: 3.8 g/dL (ref 3.5–5.0)
Alkaline Phosphatase: 121 U/L (ref 38–126)
Anion gap: 10 (ref 5–15)
BUN: 21 mg/dL — ABNORMAL HIGH (ref 6–20)
CO2: 24 mmol/L (ref 22–32)
Calcium: 9.4 mg/dL (ref 8.9–10.3)
Chloride: 96 mmol/L — ABNORMAL LOW (ref 98–111)
Creatinine, Ser: 1.29 mg/dL — ABNORMAL HIGH (ref 0.61–1.24)
GFR, Estimated: >60 mL/min (ref >60)
Glucose, Bld: 496 mg/dL — ABNORMAL HIGH (ref 70–99)
Potassium: 4 mmol/L (ref 3.5–5.1)
Sodium: 130 mmol/L — ABNORMAL LOW (ref 135–145)
Total Bilirubin: 0.9 mg/dL (ref 0.3–1.2)
Total Protein: 6.9 g/dL (ref 6.5–8.1)

## 2023-03-30 LAB — URINALYSIS, ROUTINE W REFLEX MICROSCOPIC
Bacteria, UA: NONE SEEN
Bilirubin Urine: NEGATIVE
Glucose, UA: >=500 mg/dL — AB
Hgb urine dipstick: NEGATIVE
Ketones, ur: 5 mg/dL — AB
Leukocytes,Ua: NEGATIVE
Nitrite: NEGATIVE
Protein, ur: NEGATIVE mg/dL
Specific Gravity, Urine: 1.027 (ref 1.005–1.030)
pH: 6 (ref 5.0–8.0)

## 2023-03-30 LAB — BASIC METABOLIC PANEL
Anion gap: 10 (ref 5–15)
BUN: 22 mg/dL — ABNORMAL HIGH (ref 6–20)
CO2: 22 mmol/L (ref 22–32)
Calcium: 8.8 mg/dL — ABNORMAL LOW (ref 8.9–10.3)
Chloride: 98 mmol/L (ref 98–111)
Creatinine, Ser: 1.15 mg/dL (ref 0.61–1.24)
GFR, Estimated: >60 mL/min (ref >60)
Glucose, Bld: 446 mg/dL — ABNORMAL HIGH (ref 70–99)
Potassium: 3.9 mmol/L (ref 3.5–5.1)
Sodium: 130 mmol/L — ABNORMAL LOW (ref 135–145)

## 2023-03-30 LAB — CBG MONITORING, ED
Glucose-Capillary: 476 mg/dL — ABNORMAL HIGH (ref 70–99)
Glucose-Capillary: 293 mg/dL — ABNORMAL HIGH (ref 70–99)
Glucose-Capillary: 350 mg/dL — ABNORMAL HIGH (ref 70–99)
Glucose-Capillary: 308 mg/dL — ABNORMAL HIGH (ref 70–99)

## 2023-03-30 MED ORDER — DEXTROSE 50 % IV SOLN: 0.0000 mL | INTRAVENOUS | Status: DC | PRN

## 2023-03-30 MED ORDER — INSULIN ASPART 100 UNIT/ML IJ SOLN
5.0000 [IU] | Freq: Once | INTRAMUSCULAR | Status: AC
  Administered 2023-03-30: 5 [IU] via SUBCUTANEOUS
  Filled 2023-03-30: qty 1

## 2023-03-30 MED ORDER — DEXTROSE IN LACTATED RINGERS 5 % IV SOLN: INTRAVENOUS | Status: DC

## 2023-03-30 MED ORDER — LACTATED RINGERS IV SOLN: INTRAVENOUS | Status: DC

## 2023-03-30 MED ORDER — LACTATED RINGERS IV BOLUS
20.0000 mL/kg | Freq: Once | INTRAVENOUS | Status: AC
  Administered 2023-03-30: 2132 mL via INTRAVENOUS

## 2023-03-30 NOTE — ED Notes (Signed)
Patient ambulatory to room.  

## 2023-03-30 NOTE — Discharge Instructions (Addendum)
Evaluation today revealed that your blood sugar was high and that you were dehydrated.  After treatment your blood sugar has come down nicely and your vitals continue to be stable.  Suggest you follow-up with your PCP and continue taking your metformin as prescribed.  Please have a conversation with your PCP regarding the possibility of starting on insulin in the future.  If your symptoms worsen, you develop shortness of breath altered mental status, or any other concerning symptom please return emerged part for evaluation.

## 2023-03-30 NOTE — ED Triage Notes (Addendum)
Pt complains of dizziness, dry mouth and frequent urination x 1 mouth. Pt states he has been seen for the same several times and treated for dehydration. Today started to have bilateral lower leg cramping

## 2023-03-30 NOTE — ED Provider Notes (Signed)
[place] EMERGENCY DEPARTMENT AT [facility] Provider Note   CSN: [id number] Arrival date & time: 03/30/23  1753     History  Chief Complaint  Patient presents with   Dizziness   Weakness   Hyperglycemia   HPI Jeffrey Potts is a 52 y.o. male with type 2 diabetes, hypertension and dyslipidemia presenting for hyperglycemia weakness and dizziness.  Symptoms started about 3 days ago. Also with dry mouth and frequent urination.  Dizziness is intermittent, worse with standing and feels mostly like lightheadedness.  Denies coordination issues or room spinning sensation. Also endorsed bilateral lower leg cramping as well. States he has been evaluated for hypoglycemia twice this month.  Most recently his metformin was increased.  He has been taking it as prescribed.    Dizziness Associated symptoms: weakness   Weakness Associated symptoms: dizziness   Hyperglycemia Associated symptoms: dizziness and weakness        Home Medications Prior to Admission medications   Medication Sig Start Date End Date Taking? Authorizing Provider  amLODipine (NORVASC) 5 MG tablet Take 1 tablet (5 mg total) by mouth daily. 03/13/23  Yes Zadie Rhine, MD  aspirin EC 81 MG EC tablet Take 1 tablet (81 mg total) by mouth daily. 01/26/15  Yes Elliot Cousin, MD  lisinopril (ZESTRIL) 40 MG tablet Take 1 tablet (40 mg total) by mouth daily. 03/13/23  Yes Zadie Rhine, MD  metFORMIN (GLUCOPHAGE) 500 MG tablet Take 2 tablets (1,000 mg total) by mouth 2 (two) times daily with a meal. Patient taking differently: Take 2,000 mg by mouth daily with breakfast. 03/18/23 04/17/23 Yes Mesner, Barbara Cower, MD  metoprolol succinate (TOPROL XL) 25 MG 24 hr tablet Take 1 tablet (25 mg total) by mouth daily. 03/13/23  Yes Zadie Rhine, MD  naproxen sodium (ALEVE) 220 MG tablet Take 220 mg by mouth as needed. For pain   Yes [provider]      Allergies    Mushroom extract complex, Penicillins, and  Gabapentin    Review of Systems   Review of Systems  Neurological:  Positive for dizziness and weakness.    Physical Exam Updated Vital Signs BP (!) 127/96   Pulse 75   Temp 98.7 F (37.1 C) (Oral)   Resp 20   Wt 106.6 kg   SpO2 97%   BMI 33.72 kg/m  Physical Exam Vitals and nursing note reviewed.  HENT:     Head: Normocephalic and atraumatic.     Mouth/Throat:     Mouth: Mucous membranes are moist.  Eyes:     General:        Right eye: No discharge.        Left eye: No discharge.     Conjunctiva/sclera: Conjunctivae normal.  Cardiovascular:     Rate and Rhythm: Normal rate and regular rhythm.     Pulses: Normal pulses.     Heart sounds: Normal heart sounds.  Pulmonary:     Effort: Pulmonary effort is normal.     Breath sounds: Normal breath sounds.  Abdominal:     General: Abdomen is flat.     Palpations: Abdomen is soft.  Skin:    General: Skin is warm and dry.  Neurological:     General: No focal deficit present.  Psychiatric:        Mood and Affect: Mood normal.     ED Results / Procedures / Treatments   Labs (all labs ordered are listed, but only abnormal results are displayed) Labs  Reviewed  URINALYSIS, ROUTINE W REFLEX MICROSCOPIC - Abnormal; Notable for the following components:      Result Value   Color, Urine STRAW (*)    Glucose, UA >=500 (*)    Ketones, ur 5 (*)    All other components within normal limits  COMPREHENSIVE METABOLIC PANEL - Abnormal; Notable for the following components:   Sodium 130 (*)    Chloride 96 (*)    Glucose, Bld 496 (*)    BUN 21 (*)    Creatinine, Ser 1.29 (*)    All other components within normal limits  BASIC METABOLIC PANEL - Abnormal; Notable for the following components:   Sodium 130 (*)    Glucose, Bld 446 (*)    BUN 22 (*)    Calcium 8.8 (*)    All other components within normal limits  CBG MONITORING, ED - Abnormal; Notable for the following components:   Glucose-Capillary 476 (*)    All other  components within normal limits  CBG MONITORING, ED - Abnormal; Notable for the following components:   Glucose-Capillary 293 (*)    All other components within normal limits  CBG MONITORING, ED - Abnormal; Notable for the following components:   Glucose-Capillary 350 (*)    All other components within normal limits  CBG MONITORING, ED - Abnormal; Notable for the following components:   Glucose-Capillary 308 (*)    All other components within normal limits  CBC    EKG None  Radiology No results found.  Procedures .Critical Care  Performed by: Gareth Eagle, PA-C Authorized by: Gareth Eagle, PA-C   Critical care provider statement:    Critical care time (minutes):  30   Critical care was necessary to treat or prevent imminent or life-threatening deterioration of the following conditions:  Renal failure and metabolic crisis (hyperglycemia)   Critical care was time spent personally by me on the following activities:  Development of treatment plan with patient or surrogate, discussions with consultants, evaluation of patient's response to treatment, examination of patient, ordering and review of laboratory studies, ordering and review of radiographic studies, ordering and performing treatments and interventions, pulse oximetry, re-evaluation of patient's condition and review of old charts     Medications Ordered in ED Medications  lactated ringers infusion (has no administration in time range)  dextrose 5 % in lactated ringers infusion (has no administration in time range)  dextrose 50 % solution 0-50 mL (has no administration in time range)  lactated ringers bolus 2,132 mL (2,132 mLs Intravenous New Bag/Given 03/30/23 2005)  insulin aspart (novoLOG) injection 5 Units (5 Units Subcutaneous Given 03/30/23 2008)  insulin aspart (novoLOG) injection 5 Units (5 Units Subcutaneous Given 03/30/23 2133)    ED Course/ Medical Decision Making/ A&P                                  Medical Decision Making Amount and/or Complexity of Data Reviewed Labs: ordered.  Risk Prescription drug management.   Initial Impression and Ddx 52 year old well-appearing male presenting for hyperglycemia and dizziness and weakness.  Exam was unremarkable.  Initial blood sugar was 496.  DDx includes DKA, HHS, hyperglycemia, sepsis stroke, AKI. Patient PMH that increases complexity of ED encounter: type 2 diabetes, hypertension and dyslipidemia  Interpretation of Diagnostics I independent reviewed and interpreted the labs as followed: Hyperglycemia.  After treatment with insulin and fluids blood sugar did improve, elevated BUN/creatinine  Patient Reassessment and Ultimate Disposition/Management Workup was suggestive with hyperglycemia without DKA along with mild AKI.  Treated with volume resuscitation and fluids.  Gave NovoLog 5 units x 2 and blood sugar did improve.  Advised to continue taking his metformin as prescribed.  Also advised to follow-up PCP.  Given recurrence of his hyperglycemia requiring fluid resuscitation and treatment with insulin it is likely he may need to be started on daily insulin.  Advised him to have this discussion with his primary care provider.  Discussed pertinent return precautions.  Vital stable.  Discharged home in good condition.  Patient management required discussion with the following services or consulting groups:  None  Complexity of Problems Addressed Acute complicated illness or Injury  Additional Data Reviewed and Analyzed Further history obtained from: Past medical history and medications listed in the EMR and Prior ED visit notes  Patient Encounter Risk Assessment None         Final Clinical Impression(s) / ED Diagnoses Final diagnoses:  Hyperglycemia  AKI (acute kidney injury) [facility])    Rx / DC Orders ED Discharge Orders     None         Gareth Eagle, PA-C 03/30/23 2225    Benjiman Core, MD 03/31/23  [phone]

## 2023-03-30 NOTE — ED Notes (Signed)
Ice chips given with PA approval

## 2023-03-31 ENCOUNTER — Telehealth: Payer: Self-pay

## 2023-03-31 NOTE — Telephone Encounter (Signed)
Attempted to call as Mr. Reitano was scheduled to come to [facility] office this morning at 9am to enroll in program and assistance to establish primary medical care. No answer on call and VM full, unable to leave message.   Francee Nodal RN [employer]

## 2023-04-08 ENCOUNTER — Encounter (HOSPITAL_COMMUNITY): Payer: Self-pay | Admitting: Emergency Medicine

## 2023-04-08 ENCOUNTER — Inpatient Hospital Stay (HOSPITAL_COMMUNITY)
Admission: EM | Admit: 2023-04-08 | Discharge: 2023-04-09 | DRG: 639 | Disposition: A | Discharge status: Home / Self Care | Payer: Self-pay | Attending: Internal Medicine | Admitting: Internal Medicine

## 2023-04-08 ENCOUNTER — Other Ambulatory Visit: Payer: Self-pay

## 2023-04-08 DIAGNOSIS — Z7982 Long term (current) use of aspirin: Secondary | ICD-10-CM

## 2023-04-08 DIAGNOSIS — Z794 Long term (current) use of insulin: Secondary | ICD-10-CM

## 2023-04-08 DIAGNOSIS — Z823 Family history of stroke: Secondary | ICD-10-CM

## 2023-04-08 DIAGNOSIS — Z79899 Other long term (current) drug therapy: Secondary | ICD-10-CM

## 2023-04-08 DIAGNOSIS — Z880 Allergy status to penicillin: Secondary | ICD-10-CM

## 2023-04-08 DIAGNOSIS — Z9102 Food additives allergy status: Secondary | ICD-10-CM

## 2023-04-08 DIAGNOSIS — Z800 Family history of malignant neoplasm of digestive organs: Secondary | ICD-10-CM

## 2023-04-08 DIAGNOSIS — R739 Hyperglycemia, unspecified: Principal | ICD-10-CM

## 2023-04-08 DIAGNOSIS — Z809 Family history of malignant neoplasm, unspecified: Secondary | ICD-10-CM

## 2023-04-08 DIAGNOSIS — I10 Essential (primary) hypertension: Secondary | ICD-10-CM | POA: Diagnosis present

## 2023-04-08 DIAGNOSIS — Z888 Allergy status to other drugs, medicaments and biological substances status: Secondary | ICD-10-CM

## 2023-04-08 DIAGNOSIS — Z825 Family history of asthma and other chronic lower respiratory diseases: Secondary | ICD-10-CM

## 2023-04-08 DIAGNOSIS — E1100 Type 2 diabetes mellitus with hyperosmolarity without nonketotic hyperglycemic-hyperosmolar coma (NKHHC): Principal | ICD-10-CM | POA: Diagnosis present

## 2023-04-08 DIAGNOSIS — Z7984 Long term (current) use of oral hypoglycemic drugs: Secondary | ICD-10-CM

## 2023-04-08 LAB — CBG MONITORING, ED: Glucose-Capillary: >600 mg/dL (ref 70–99)

## 2023-04-08 MED ORDER — DEXTROSE IN LACTATED RINGERS 5 % IV SOLN: INTRAVENOUS | Status: DC

## 2023-04-08 MED ORDER — DEXTROSE 50 % IV SOLN: 0.0000 mL | INTRAVENOUS | Status: DC | PRN

## 2023-04-08 MED ORDER — LACTATED RINGERS IV SOLN: INTRAVENOUS | Status: DC

## 2023-04-08 MED ORDER — LACTATED RINGERS IV BOLUS
20.0000 mL/kg | Freq: Once | INTRAVENOUS | Status: AC
  Administered 2023-04-09: 1000 mL via INTRAVENOUS

## 2023-04-08 MED ORDER — INSULIN REGULAR(HUMAN) IN NACL 100-0.9 UT/100ML-% IV SOLN
INTRAVENOUS | Status: DC
  Administered 2023-04-09 (×2): 18 [IU]/h via INTRAVENOUS
  Filled 2023-04-08: qty 100

## 2023-04-08 NOTE — ED Triage Notes (Signed)
Pt reports dry mouth, dizziness, and nausea. Reports issues w/ blood sugars. CBG in triage above 600.

## 2023-04-09 DIAGNOSIS — E1100 Type 2 diabetes mellitus with hyperosmolarity without nonketotic hyperglycemic-hyperosmolar coma (NKHHC): Secondary | ICD-10-CM | POA: Diagnosis present

## 2023-04-09 LAB — CBC
HCT: 43.2 % (ref 39.0–52.0)
HCT: 47.5 % (ref 39.0–52.0)
Hemoglobin: 15 g/dL (ref 13.0–17.0)
Hemoglobin: 16.5 g/dL (ref 13.0–17.0)
MCH: 29.2 pg (ref 26.0–34.0)
MCH: 29.6 pg (ref 26.0–34.0)
MCHC: 34.7 g/dL (ref 30.0–36.0)
MCHC: 34.7 g/dL (ref 30.0–36.0)
MCV: 84 fL (ref 80.0–100.0)
MCV: 85.1 fL (ref 80.0–100.0)
Platelets: 198 10*3/uL (ref 150–400)
Platelets: 234 10*3/uL (ref 150–400)
RBC: 5.14 MIL/uL (ref 4.22–5.81)
RBC: 5.58 MIL/uL (ref 4.22–5.81)
RDW: 13.2 % (ref 11.5–15.5)
RDW: 13.2 % (ref 11.5–15.5)
WBC: 7.4 10*3/uL (ref 4.0–10.5)
WBC: 7.5 10*3/uL (ref 4.0–10.5)
nRBC: 0 % (ref 0.0–0.2)
nRBC: 0 % (ref 0.0–0.2)

## 2023-04-09 LAB — HEMOGLOBIN A1C
Hgb A1c MFr Bld: 11.6 % — ABNORMAL HIGH (ref 4.8–5.6)
Mean Plasma Glucose: 286.22 mg/dL

## 2023-04-09 LAB — URINALYSIS, ROUTINE W REFLEX MICROSCOPIC
Bacteria, UA: NONE SEEN
Bilirubin Urine: NEGATIVE
Glucose, UA: >=500 mg/dL — AB
Ketones, ur: NEGATIVE mg/dL
Leukocytes,Ua: NEGATIVE
Nitrite: NEGATIVE
Protein, ur: NEGATIVE mg/dL
Specific Gravity, Urine: 1.026 (ref 1.005–1.030)
pH: 5 (ref 5.0–8.0)

## 2023-04-09 LAB — BASIC METABOLIC PANEL
Anion gap: 11 (ref 5–15)
Anion gap: 9 (ref 5–15)
BUN: 12 mg/dL (ref 6–20)
BUN: 11 mg/dL (ref 6–20)
CO2: 23 mmol/L (ref 22–32)
CO2: 23 mmol/L (ref 22–32)
Calcium: 10.2 mg/dL (ref 8.9–10.3)
Calcium: 8.9 mg/dL (ref 8.9–10.3)
Chloride: 93 mmol/L — ABNORMAL LOW (ref 98–111)
Chloride: 104 mmol/L (ref 98–111)
Creatinine, Ser: 1.19 mg/dL (ref 0.61–1.24)
Creatinine, Ser: 0.92 mg/dL (ref 0.61–1.24)
GFR, Estimated: >60 mL/min (ref >60)
GFR, Estimated: >60 mL/min (ref >60)
Glucose, Bld: 745 mg/dL (ref 70–99)
Glucose, Bld: 251 mg/dL — ABNORMAL HIGH (ref 70–99)
Potassium: 4.3 mmol/L (ref 3.5–5.1)
Potassium: 3.9 mmol/L (ref 3.5–5.1)
Sodium: 127 mmol/L — ABNORMAL LOW (ref 135–145)
Sodium: 136 mmol/L (ref 135–145)

## 2023-04-09 LAB — BLOOD GAS, VENOUS
Acid-Base Excess: 0.5 mmol/L (ref 0.0–2.0)
Bicarbonate: 25.4 mmol/L (ref 20.0–28.0)
O2 Saturation: 70.4 %
Patient temperature: 36.1
pCO2, Ven: 39 mmHg — ABNORMAL LOW (ref 44–60)
pH, Ven: 7.41 (ref 7.25–7.43)
pO2, Ven: 37 mmHg (ref 32–45)

## 2023-04-09 LAB — CBG MONITORING, ED
Glucose-Capillary: 177 mg/dL — ABNORMAL HIGH (ref 70–99)
Glucose-Capillary: 185 mg/dL — ABNORMAL HIGH (ref 70–99)
Glucose-Capillary: 187 mg/dL — ABNORMAL HIGH (ref 70–99)
Glucose-Capillary: 188 mg/dL — ABNORMAL HIGH (ref 70–99)
Glucose-Capillary: 196 mg/dL — ABNORMAL HIGH (ref 70–99)
Glucose-Capillary: 212 mg/dL — ABNORMAL HIGH (ref 70–99)
Glucose-Capillary: 213 mg/dL — ABNORMAL HIGH (ref 70–99)
Glucose-Capillary: 229 mg/dL — ABNORMAL HIGH (ref 70–99)
Glucose-Capillary: 236 mg/dL — ABNORMAL HIGH (ref 70–99)
Glucose-Capillary: 258 mg/dL — ABNORMAL HIGH (ref 70–99)
Glucose-Capillary: 386 mg/dL — ABNORMAL HIGH (ref 70–99)
Glucose-Capillary: >600 mg/dL (ref 70–99)
Glucose-Capillary: >600 mg/dL (ref 70–99)

## 2023-04-09 LAB — I-STAT CHEM 8, ED
BUN: 11 mg/dL (ref 6–20)
Calcium, Ion: 1.33 mmol/L (ref 1.15–1.40)
Chloride: 95 mmol/L — ABNORMAL LOW (ref 98–111)
Creatinine, Ser: 1.1 mg/dL (ref 0.61–1.24)
Glucose, Bld: >700 mg/dL (ref 70–99)
HCT: 50 % (ref 39.0–52.0)
Hemoglobin: 17 g/dL (ref 13.0–17.0)
Potassium: 4.3 mmol/L (ref 3.5–5.1)
Sodium: 131 mmol/L — ABNORMAL LOW (ref 135–145)
TCO2: 22 mmol/L (ref 22–32)

## 2023-04-09 LAB — MAGNESIUM: Magnesium: 2 mg/dL (ref 1.7–2.4)

## 2023-04-09 LAB — BETA-HYDROXYBUTYRIC ACID: Beta-Hydroxybutyric Acid: 0.2 mmol/L (ref 0.05–0.27)

## 2023-04-09 LAB — PHOSPHORUS: Phosphorus: 4.3 mg/dL (ref 2.5–4.6)

## 2023-04-09 MED ORDER — PROCHLORPERAZINE EDISYLATE 10 MG/2ML IJ SOLN: 5.0000 mg | Freq: Four times a day (QID) | INTRAMUSCULAR | Status: DC | PRN

## 2023-04-09 MED ORDER — INSULIN REGULAR(HUMAN) IN NACL 100-0.9 UT/100ML-% IV SOLN
INTRAVENOUS | Status: DC
  Administered 2023-04-09: 3.8 [IU]/h via INTRAVENOUS

## 2023-04-09 MED ORDER — BLOOD GLUCOSE TEST VI STRP: 1.0000 | ORAL_STRIP | Freq: Three times a day (TID) | 0 refills | Status: AC

## 2023-04-09 MED ORDER — PEN NEEDLES 32G X 4 MM MISC: 1.0000 | Freq: Three times a day (TID) | 0 refills | Status: AC

## 2023-04-09 MED ORDER — INSULIN GLARGINE 100 UNIT/ML SOLOSTAR PEN: 10.0000 [IU] | PEN_INJECTOR | Freq: Every day | SUBCUTANEOUS | 0 refills | Status: DC

## 2023-04-09 MED ORDER — BLOOD GLUCOSE MONITORING SUPPL DEVI: 1.0000 | Freq: Three times a day (TID) | 0 refills | Status: DC

## 2023-04-09 MED ORDER — POTASSIUM CHLORIDE 10 MEQ/100ML IV SOLN
10.0000 meq | INTRAVENOUS | Status: AC
  Administered 2023-04-09 (×2): 10 meq via INTRAVENOUS
  Filled 2023-04-09 (×2): qty 100

## 2023-04-09 MED ORDER — DEXTROSE 50 % IV SOLN: 0.0000 mL | INTRAVENOUS | Status: DC | PRN

## 2023-04-09 MED ORDER — LANCET DEVICE MISC: 1.0000 | Freq: Three times a day (TID) | 0 refills | Status: AC

## 2023-04-09 MED ORDER — DEXTROSE IN LACTATED RINGERS 5 % IV SOLN: INTRAVENOUS | Status: DC

## 2023-04-09 MED ORDER — AMLODIPINE BESYLATE 5 MG PO TABS
5.0000 mg | ORAL_TABLET | Freq: Every day | ORAL | Status: DC
  Administered 2023-04-09: 5 mg via ORAL
  Filled 2023-04-09: qty 1

## 2023-04-09 MED ORDER — POTASSIUM CHLORIDE 10 MEQ/100ML IV SOLN
10.0000 meq | INTRAVENOUS | Status: AC
  Administered 2023-04-09: 10 meq via INTRAVENOUS

## 2023-04-09 MED ORDER — LISINOPRIL 10 MG PO TABS
40.0000 mg | ORAL_TABLET | Freq: Every day | ORAL | Status: DC
  Administered 2023-04-09: 40 mg via ORAL
  Filled 2023-04-09: qty 4

## 2023-04-09 MED ORDER — ENOXAPARIN SODIUM 40 MG/0.4ML IJ SOSY
40.0000 mg | PREFILLED_SYRINGE | Freq: Every day | INTRAMUSCULAR | Status: DC
  Administered 2023-04-09: 40 mg via SUBCUTANEOUS
  Filled 2023-04-09: qty 0.4

## 2023-04-09 MED ORDER — LANCETS MISC. MISC
1.0000 | Freq: Three times a day (TID) | 0 refills | Status: AC
Start: 2023-04-09 — End: 2023-05-09

## 2023-04-09 MED ORDER — LACTATED RINGERS IV SOLN: INTRAVENOUS | Status: DC

## 2023-04-09 MED ORDER — ACETAMINOPHEN 325 MG PO TABS: 650.0000 mg | ORAL_TABLET | Freq: Four times a day (QID) | ORAL | Status: DC | PRN

## 2023-04-09 MED ORDER — METOPROLOL SUCCINATE ER 25 MG PO TB24
25.0000 mg | ORAL_TABLET | Freq: Every day | ORAL | Status: DC
  Administered 2023-04-09: 25 mg via ORAL
  Filled 2023-04-09: qty 1

## 2023-04-09 MED ORDER — SODIUM CHLORIDE 0.9 % IV BOLUS (SEPSIS)
1000.0000 mL | Freq: Once | INTRAVENOUS | Status: AC
  Administered 2023-04-09: 1000 mL via INTRAVENOUS

## 2023-04-09 MED ORDER — INSULIN GLARGINE-YFGN 100 UNIT/ML ~~LOC~~ SOLN
10.0000 [IU] | Freq: Every day | SUBCUTANEOUS | Status: DC
  Administered 2023-04-09: 10 [IU] via SUBCUTANEOUS
  Filled 2023-04-09: qty 0.1

## 2023-04-09 MED ORDER — LACTATED RINGERS IV BOLUS: 20.0000 mL/kg | Freq: Once | INTRAVENOUS | Status: DC

## 2023-04-09 NOTE — ED Notes (Signed)
Pt was given a lunch tray at 1230p. Tolerated without any n/v.

## 2023-04-09 NOTE — ED Provider Notes (Signed)
[place] EMERGENCY DEPARTMENT AT [facility] Provider Note   CSN: [id number] Arrival date & time: 04/08/23  2308     History  Chief Complaint  Patient presents with   Hyperglycemia    Jeffrey Potts is a 52 y.o. male.  The history is provided by the patient and the EMS personnel.  Hyperglycemia Severity:  Severe Onset quality:  Gradual Timing:  Constant Progression:  Unchanged Chronicity:  Chronic Diabetes status:  Controlled with oral medications Current diabetic therapy:  Metformin Context: not change in medication   Relieved by:  Nothing Ineffective treatments:  None tried Associated symptoms: increased thirst and polyuria   Associated symptoms: no abdominal pain, no dysuria, no fever and no vomiting   Risk factors: no pancreatic disease   HTN and Dm with elevated glucose.  Has not seen anyone and is not able to check sugars at home.  Is symptomatic.      Past Medical History:  Diagnosis Date   Hypertension    Hypertensive urgency 01/25/2015     Home Medications Prior to Admission medications   Medication Sig Start Date End Date Taking? Authorizing Provider  amLODipine (NORVASC) 5 MG tablet Take 1 tablet (5 mg total) by mouth daily. 03/13/23  Yes Zadie Rhine, MD  aspirin EC 81 MG EC tablet Take 1 tablet (81 mg total) by mouth daily. 01/26/15  Yes Elliot Cousin, MD  lisinopril (ZESTRIL) 40 MG tablet Take 1 tablet (40 mg total) by mouth daily. 03/13/23  Yes Zadie Rhine, MD  metFORMIN (GLUCOPHAGE) 500 MG tablet Take 2 tablets (1,000 mg total) by mouth 2 (two) times daily with a meal. Patient taking differently: Take 2,000 mg by mouth daily with breakfast. 03/18/23 04/17/23 Yes Mesner, Barbara Cower, MD  metoprolol succinate (TOPROL XL) 25 MG 24 hr tablet Take 1 tablet (25 mg total) by mouth daily. 03/13/23  Yes Zadie Rhine, MD  naproxen sodium (ALEVE) 220 MG tablet Take 220 mg by mouth as needed. For pain   Yes [provider]       Allergies    Mushroom extract complex, Penicillins, and Gabapentin    Review of Systems   Review of Systems  Unable to perform ROS: Acuity of condition  Constitutional:  Negative for fever.  HENT:  Negative for voice change.   Respiratory:  Negative for wheezing and stridor.   Gastrointestinal:  Negative for abdominal pain and vomiting.  Endocrine: Positive for polydipsia and polyuria.  Genitourinary:  Negative for dysuria.    Physical Exam Updated Vital Signs BP (!) 176/109   Pulse (!) 104   Temp 97.6 F (36.4 C) (Oral)   Resp 12   SpO2 97%  Physical Exam Vitals and nursing note reviewed.  Constitutional:      General: He is not in acute distress.    Appearance: Normal appearance. He is well-developed. He is not diaphoretic.  HENT:     Head: Normocephalic and atraumatic.     Nose: Nose normal.  Eyes:     Conjunctiva/sclera: Conjunctivae normal.     Pupils: Pupils are equal, round, and reactive to light.  Cardiovascular:     Rate and Rhythm: Normal rate and regular rhythm.     Pulses: Normal pulses.     Heart sounds: Normal heart sounds.  Pulmonary:     Effort: Pulmonary effort is normal.     Breath sounds: Normal breath sounds. No wheezing or rales.  Abdominal:     General: Bowel sounds are normal.  Palpations: Abdomen is soft.     Tenderness: There is no abdominal tenderness. There is no guarding or rebound.  Musculoskeletal:        General: Normal range of motion.     Cervical back: Normal range of motion and neck supple.  Skin:    General: Skin is warm and dry.     Capillary Refill: Capillary refill takes less than 2 seconds.  Neurological:     General: No focal deficit present.     Mental Status: He is alert and oriented to person, place, and time.     Deep Tendon Reflexes: Reflexes normal.  Psychiatric:        Mood and Affect: Mood normal.     ED Results / Procedures / Treatments   Labs (all labs ordered are listed, but only abnormal results  are displayed) Results for orders placed or performed during the hospital encounter of 04/08/23  CBC  Result Value Ref Range   WBC 7.5 4.0 - 10.5 K/uL   RBC 5.58 4.22 - 5.81 MIL/uL   Hemoglobin 16.5 13.0 - 17.0 g/dL   HCT 86.5 78.4 - 69.6 %   MCV 85.1 80.0 - 100.0 fL   MCH 29.6 26.0 - 34.0 pg   MCHC 34.7 30.0 - 36.0 g/dL   RDW 29.5 28.4 - 13.2 %   Platelets 234 150 - 400 K/uL   nRBC 0.0 0.0 - 0.2 %  Urinalysis, Routine w reflex microscopic -Urine, Clean Catch  Result Value Ref Range   Color, Urine COLORLESS (A) YELLOW   APPearance CLEAR CLEAR   Specific Gravity, Urine 1.026 1.005 - 1.030   pH 5.0 5.0 - 8.0   Glucose, UA >=500 (A) NEGATIVE mg/dL   Hgb urine dipstick SMALL (A) NEGATIVE   Bilirubin Urine NEGATIVE NEGATIVE   Ketones, ur NEGATIVE NEGATIVE mg/dL   Protein, ur NEGATIVE NEGATIVE mg/dL   Nitrite NEGATIVE NEGATIVE   Leukocytes,Ua NEGATIVE NEGATIVE   RBC / HPF 0-5 0 - 5 RBC/hpf   WBC, UA 0-5 0 - 5 WBC/hpf   Bacteria, UA NONE SEEN NONE SEEN   Squamous Epithelial / HPF 0-5 0 - 5 /HPF  Basic metabolic panel  Result Value Ref Range   Sodium 127 (L) 135 - 145 mmol/L   Potassium 4.3 3.5 - 5.1 mmol/L   Chloride 93 (L) 98 - 111 mmol/L   CO2 23 22 - 32 mmol/L   Glucose, Bld 745 (HH) 70 - 99 mg/dL   BUN 12 6 - 20 mg/dL   Creatinine, Ser 4.40 0.61 - 1.24 mg/dL   Calcium 10.2 8.9 - 72.5 mg/dL   GFR, Estimated >36 >64 mL/min   Anion gap 11 5 - 15  Beta-hydroxybutyric acid  Result Value Ref Range   Beta-Hydroxybutyric Acid 0.20 0.05 - 0.27 mmol/L  Blood gas, venous  Result Value Ref Range   pH, Ven 7.41 7.25 - 7.43   pCO2, Ven 39 (L) 44 - 60 mmHg   pO2, Ven 37 32 - 45 mmHg   Bicarbonate 25.4 20.0 - 28.0 mmol/L   Acid-Base Excess 0.5 0.0 - 2.0 mmol/L   O2 Saturation 70.4 %   Patient temperature 36.1   CBG monitoring, ED  Result Value Ref Range   Glucose-Capillary >600 (HH) 70 - 99 mg/dL  CBG monitoring, ED  Result Value Ref Range   Glucose-Capillary >600 (HH) 70 -  99 mg/dL  I-stat chem 8, ed  Result Value Ref Range   Sodium 131 (L) 135 -  145 mmol/L   Potassium 4.3 3.5 - 5.1 mmol/L   Chloride 95 (L) 98 - 111 mmol/L   BUN 11 6 - 20 mg/dL   Creatinine, Ser 0.45 0.61 - 1.24 mg/dL   Glucose, Bld >409 (HH) 70 - 99 mg/dL   Calcium, Ion 8.11 9.14 - 1.40 mmol/L   TCO2 22 22 - 32 mmol/L   Hemoglobin 17.0 13.0 - 17.0 g/dL   HCT 78.2 95.6 - 21.3 %   Comment NOTIFIED PHYSICIAN   CBG monitoring, ED  Result Value Ref Range   Glucose-Capillary >600 (HH) 70 - 99 mg/dL  CBG monitoring, ED  Result Value Ref Range   Glucose-Capillary 386 (H) 70 - 99 mg/dL   No results found.  None  Radiology No results found.  Procedures .Critical Care E&M  Performed by: Cy Blamer, MD Critical care provider statement:    Critical care time (minutes):  60   Critical care was necessary to treat or prevent imminent or life-threatening deterioration of the following conditions:  Endocrine crisis   Critical care was time spent personally by me on the following activities:  Evaluation of patient's response to treatment, ordering and performing treatments and interventions, ordering and review of laboratory studies and pulse oximetry   Care discussed with: admitting provider   After initial E/M assessment, critical care services were subsequently performed that were exclusive of separately billable procedures or treatment.       Medications Ordered in ED Medications  insulin regular, human (MYXREDLIN) 100 units/ 100 mL infusion (9 Units/hr Intravenous Rate/Dose Change 04/09/23 0212)  lactated ringers infusion (has no administration in time range)  dextrose 5 % in lactated ringers infusion (has no administration in time range)  dextrose 50 % solution 0-50 mL (has no administration in time range)  lactated ringers bolus 2,132 mL (1,000 mLs Intravenous New Bag/Given 04/09/23 0230)  sodium chloride 0.9 % bolus 1,000 mL (0 mLs Intravenous Stopped 04/09/23 0226)    ED  Course/ Medical Decision Making/ A&P                                 Medical Decision Making Markedly elevated sugars with symptoms.  Has not followed up from previous visits.  Is not checking sugar at home   Amount and/or Complexity of Data Reviewed Independent Historian: EMS    Details: See above  External Data Reviewed: labs and notes.    Details: Previous ED visits reviewed  Labs: ordered.    Details: Multiple ED glucose > 700, BMP 745 with normal anion gap.  Urine with glucose but not ketones, no UTI.  Normal white count 7.5, normal hemoglobin 16.5, normal platelets.  Sodium 127 corrections to normal, normal potassium 4.3, normal creatinine 1.19   Risk Prescription drug management. Decision regarding hospitalization.  Critical Care Total time providing critical care: 60 minutes    Final Clinical Impression(s) / ED Diagnoses Final diagnoses:  Hyperglycemia   The patient appears reasonably stabilized for admission considering the current resources, flow, and capabilities available in the ED at this time, and I doubt any other [facility] requiring further screening and/or treatment in the ED prior to admission.  Rx / DC Orders ED Discharge Orders     None         Analuisa Tudor, MD 04/09/23 0865

## 2023-04-09 NOTE — H&P (Signed)
History and Physical  Jeffrey Potts ZOX:[id number] DOB: 07-26-1971 DOA: 04/08/2023  Referring physician: Dr. Nicanor Alcon, EDP  PCP: Dettinger, Elige Radon, MD  Outpatient Specialists: None. Patient coming from: Home.  Chief Complaint: Polyuria, polydipsia, dizziness and nausea.  HPI: Jeffrey Potts is a 52 y.o. male with medical history significant for morbid obesity, type 2 diabetes, hypertension, who presents to the ED with complaints of sudden onset dizziness associated with polyuria, dry mouth, and nausea without vomiting.  No visual changes.  He drove himself to the hospital.  Denies any subjective fevers or chills.  States he has been taking 2000 mg metformin daily.  Currently does not have a primary care provider.  Has had multiple ED visits for uncontrolled diabetes.  In the ED, blood sugar severely elevated greater than 700.  Due to concern for HHS the patient was started on insulin drip and IV fluid.  Admitted by [facility], hospitalist service.  ED Course: Temperature 97.7.  BP 148/104, pulse 87, respiratory rate 18, saturation 95% on room air.  Review of Systems: Review of systems as noted in the HPI. All other systems reviewed and are negative.   Past Medical History:  Diagnosis Date   Hypertension    Hypertensive urgency 01/25/2015   Past Surgical History:  Procedure Laterality Date   LACERATION REPAIR     R thumb   LACERATION REPAIR  L inner thigh   SHOULDER SURGERY Right     Social History:  reports that he has never smoked. He has never used smokeless tobacco. He reports that he does not drink alcohol and does not use drugs.   Allergies  Allergen Reactions   Mushroom Extract Complex Shortness Of Breath   Penicillins Other (See Comments), Anaphylaxis and Hives    Dry Throat  Other reaction(s): Other (See Comments)  Dry Throat  Dry Throat   Gabapentin Rash    Family History  Problem Relation Age of Onset   COPD Mother    Cancer Other    Cancer Father         colon   Stroke Father       Prior to Admission medications   Medication Sig Start Date End Date Taking? Authorizing Provider  amLODipine (NORVASC) 5 MG tablet Take 1 tablet (5 mg total) by mouth daily. 03/13/23  Yes Zadie Rhine, MD  aspirin EC 81 MG EC tablet Take 1 tablet (81 mg total) by mouth daily. 01/26/15  Yes Elliot Cousin, MD  lisinopril (ZESTRIL) 40 MG tablet Take 1 tablet (40 mg total) by mouth daily. 03/13/23  Yes Zadie Rhine, MD  metFORMIN (GLUCOPHAGE) 500 MG tablet Take 2 tablets (1,000 mg total) by mouth 2 (two) times daily with a meal. Patient taking differently: Take 2,000 mg by mouth daily with breakfast. 03/18/23 04/17/23 Yes Mesner, Barbara Cower, MD  metoprolol succinate (TOPROL XL) 25 MG 24 hr tablet Take 1 tablet (25 mg total) by mouth daily. 03/13/23  Yes Zadie Rhine, MD  naproxen sodium (ALEVE) 220 MG tablet Take 220 mg by mouth as needed. For pain   Yes [provider]    Physical Exam: BP (!) 176/109   Pulse (!) 104   Temp 97.7 F (36.5 C) (Oral)   Resp 12   SpO2 97%   General: 52 y.o. year-old male well developed well nourished in no acute distress.  Alert and oriented x3. Cardiovascular: Regular rate and rhythm with no rubs or gallops.  No thyromegaly or JVD noted.  No lower extremity edema.  2/4 pulses in all 4 extremities. Respiratory: Clear to auscultation with no wheezes or rales. Good inspiratory effort. Abdomen: Soft nontender nondistended with normal bowel sounds x4 quadrants. Muskuloskeletal: No cyanosis, clubbing or edema noted bilaterally Neuro: CN II-XII intact, strength, sensation, reflexes Skin: No ulcerative lesions noted or rashes Psychiatry: Judgement and insight appear normal. Mood is appropriate for condition and setting          Labs on Admission:  Basic Metabolic Panel: Recent Labs  Lab 04/09/23 0021 04/09/23 0035  NA 127* 131*  K 4.3 4.3  CL 93* 95*  CO2 23  --   GLUCOSE 745* >700*  BUN 12 11  CREATININE 1.19  1.10  CALCIUM 10.2  --    Liver Function Tests: No results for input(s): "AST", "ALT", "ALKPHOS", "BILITOT", "PROT", "ALBUMIN" in the last 168 hours. No results for input(s): "LIPASE", "AMYLASE" in the last 168 hours. No results for input(s): "AMMONIA" in the last 168 hours. CBC: Recent Labs  Lab 04/09/23 0021 04/09/23 0035  WBC 7.5  --   HGB 16.5 17.0  HCT 47.5 50.0  MCV 85.1  --   PLT 234  --    Cardiac Enzymes: No results for input(s): "CKTOTAL", "CKMB", "CKMBINDEX", "TROPONINI" in the last 168 hours.  BNP (last 3 results) No results for input(s): "BNP" in the last 8760 hours.  ProBNP (last 3 results) No results for input(s): "PROBNP" in the last 8760 hours.  CBG: Recent Labs  Lab 04/08/23 2316 04/09/23 0018 04/09/23 0115 04/09/23 0208 04/09/23 0314  GLUCAP >600* >600* >600* 386* 177*    Radiological Exams on Admission: No results found.  EKG: I independently viewed the EKG done and my findings are as followed: None available at the time of this visit.  Assessment/Plan Present on Admission:  Hyperosmolar hyperglycemic state (HHS) (HCC)  Principal Problem:   Hyperosmolar hyperglycemic state (HHS) (HCC)  Hyperosmolar hyperglycemic state Presented with polyuria polydipsia nausea dizziness. Blood sugar greater than 700 Currently on HHS protocol Diabetes coordinator for patient's education Obtain hemoglobin A1c Continue insulin and IV fluid  Dizziness, improved in the ED Obtain orthostatic vital signs  Hypertension, BP is not at goal, elevated Resume home oral antihypertensives amlodipine, lisinopril and metoprolol succinate Closely monitor vital signs  Pseudohyponatremia in the setting of hyperglycemia Corrected sodium for hyperglycemia 141 Continue LR  Severe morbid obesity Weight 106 kg Recommend weight loss outpatient with regular physical activity and healthy dieting.   Time: 75-minutes.   DVT prophylaxis: Subcu Lovenox daily  Code  Status: Full code  Family Communication: None at bedside.  Disposition Plan: Admitted to stepdown unit  Consults called: Diabetes coordinator.  Admission status: Inpatient status.   Status is: Inpatient The patient requires at least 2 midnights for further evaluation and treatment of present condition.   Darlin Drop MD [facility] Pager [phone]  If 7PM-7AM, please contact night-coverage [email or web address] Password [email or web address]  04/09/2023, 4:45 AM

## 2023-04-09 NOTE — Discharge Summary (Signed)
Physician Discharge Summary  Jeffrey Potts ZOX:[id number] DOB: [birth year] DOA: 04/08/2023  PCP: Dettinger, Elige Radon, MD  Admit date: 04/08/2023 Discharge date: 04/09/2023  Admitted From: Home Disposition:  Home  Recommendations for Outpatient Follow-up:  Follow up with PCP in 1-2 weeks  Home Health:None  Equipment/Devices:None  Discharge Condition:Stable  CODE STATUS:Full  Diet recommendation: Low carb diet    Brief/Interim Summary: Patient is a 52 y.o. male with medical history significant for morbid obesity, type 2 diabetes, hypertension, who presents to the ED with complaints of sudden onset dizziness associated with polyuria, dry mouth, and nausea without vomiting.  No visual changes.  He drove himself to the hospital.  Denies any subjective fevers or chills.  States he has been taking 2000 mg metformin daily.  Currently does not have a primary care provider.  Has had multiple ED visits for uncontrolled diabetes.   Given HHS and profoundly uncontrolled glucose at baseline with A1c >11 patient was transitioned to low dose long acting insulin with recommendations for very strict diabetic diet.  Discharge Diagnoses:  Principal Problem:   Hyperosmolar hyperglycemic state (HHS) [facility])    Discharge Instructions   Allergies as of 04/09/2023       Reactions   Mushroom Extract Complex Shortness Of Breath   Penicillins Other (See Comments), Anaphylaxis, Hives   Dry Throat Other reaction(s): Other (See Comments)  Dry Throat  Dry Throat   Gabapentin Rash        Medication List     TAKE these medications    amLODipine 5 MG tablet Commonly known as: NORVASC Take 1 tablet (5 mg total) by mouth daily.   aspirin EC 81 MG tablet Take 1 tablet (81 mg total) by mouth daily.   Blood Glucose Monitoring Suppl Devi 1 each by Does not apply route in the morning, at noon, and at bedtime. May substitute to any manufacturer covered by patient's insurance.   BLOOD GLUCOSE TEST STRIPS  Strp 1 each by In Vitro route in the morning, at noon, and at bedtime. May substitute to any manufacturer covered by patient's insurance.   insulin glargine 100 UNIT/ML Solostar Pen Commonly known as: LANTUS Inject 10 Units into the skin daily.   Lancet Device Misc 1 each by Does not apply route in the morning, at noon, and at bedtime. May substitute to any manufacturer covered by patient's insurance.   Lancets Misc. Misc 1 each by Does not apply route in the morning, at noon, and at bedtime. May substitute to any manufacturer covered by patient's insurance.   lisinopril 40 MG tablet Commonly known as: ZESTRIL Take 1 tablet (40 mg total) by mouth daily.   metFORMIN 500 MG tablet Commonly known as: GLUCOPHAGE Take 2 tablets (1,000 mg total) by mouth 2 (two) times daily with a meal. What changed:  how much to take when to take this   metoprolol succinate 25 MG 24 hr tablet Commonly known as: Toprol XL Take 1 tablet (25 mg total) by mouth daily.   naproxen sodium 220 MG tablet Commonly known as: ALEVE Take 220 mg by mouth as needed. For pain   Pen Needles 32G X 4 MM Misc 1 each by Does not apply route 3 (three) times daily before meals.        Allergies  Allergen Reactions   Mushroom Extract Complex Shortness Of Breath   Penicillins Other (See Comments), Anaphylaxis and Hives    Dry Throat  Other reaction(s): Other (See Comments)  Dry Throat  Dry  Throat   Gabapentin Rash    Consultations: None   Procedures/Studies: No results found.   Subjective: No acute issues/events overnight. Symptoms resolved with glucose correction.   Discharge Exam: Vitals:   04/09/23 1500 04/09/23 1515  BP: (!) 162/99 (!) 150/98  Pulse: 79 74  Resp: (!) 21 12  Temp:    SpO2: 94% 97%   Vitals:   04/09/23 1100 04/09/23 1125 04/09/23 1500 04/09/23 1515  BP: (!) 148/94  (!) 162/99 (!) 150/98  Pulse: 77  79 74  Resp: 12  (!) 21 12  Temp:  97.9 F (36.6 C)    TempSrc:  Oral     SpO2: 93%  94% 97%    General: Pt is alert, awake, not in acute distress Cardiovascular: RRR, S1/S2 +, no rubs, no gallops Respiratory: CTA bilaterally, no wheezing, no rhonchi Abdominal: Soft, NT, ND, bowel sounds + Extremities: no edema, no cyanosis    The results of significant diagnostics from this hospitalization (including imaging, microbiology, ancillary and laboratory) are listed below for reference.     Microbiology: No results found for this or any previous visit (from the past 240 hour(s)).   Labs: BNP (last 3 results) No results for input(s): "BNP" in the last 8760 hours. Basic Metabolic Panel: Recent Labs  Lab 04/09/23 0021 04/09/23 0035 04/09/23 0608  NA 127* 131* 136  K 4.3 4.3 3.9  CL 93* 95* 104  CO2 23  --  23  GLUCOSE 745* >700* 251*  BUN 12 11 11   CREATININE 1.19 1.10 0.92  CALCIUM 10.2  --  8.9  MG  --   --  2.0  PHOS  --   --  4.3   Liver Function Tests: No results for input(s): "AST", "ALT", "ALKPHOS", "BILITOT", "PROT", "ALBUMIN" in the last 168 hours. No results for input(s): "LIPASE", "AMYLASE" in the last 168 hours. No results for input(s): "AMMONIA" in the last 168 hours. CBC: Recent Labs  Lab 04/09/23 0021 04/09/23 0035 04/09/23 0608  WBC 7.5  --  7.4  HGB 16.5 17.0 15.0  HCT 47.5 50.0 43.2  MCV 85.1  --  84.0  PLT 234  --  198   Cardiac Enzymes: No results for input(s): "CKTOTAL", "CKMB", "CKMBINDEX", "TROPONINI" in the last 168 hours. BNP: Invalid input(s): "POCBNP" CBG: Recent Labs  Lab 04/09/23 1044 04/09/23 1142 04/09/23 1251 04/09/23 1411 04/09/23 1458  GLUCAP 187* 213* 185* 258* 236*   D-Dimer No results for input(s): "DDIMER" in the last 72 hours. Hgb A1c Recent Labs    04/09/23 0608  HGBA1C 11.6*   Lipid Profile No results for input(s): "CHOL", "HDL", "LDLCALC", "TRIG", "CHOLHDL", "LDLDIRECT" in the last 72 hours. Thyroid function studies No results for input(s): "TSH", "T4TOTAL", "T3FREE",  "THYROIDAB" in the last 72 hours.  Invalid input(s): "FREET3" Anemia work up No results for input(s): "VITAMINB12", "FOLATE", "FERRITIN", "TIBC", "IRON", "RETICCTPCT" in the last 72 hours. Urinalysis    Component Value Date/Time   COLORURINE COLORLESS (A) 04/09/2023 0045   APPEARANCEUR CLEAR 04/09/2023 0045   APPEARANCEUR Clear 10/15/2020 0947   LABSPEC 1.026 04/09/2023 0045   PHURINE 5.0 04/09/2023 0045   GLUCOSEU >=500 (A) 04/09/2023 0045   HGBUR SMALL (A) 04/09/2023 0045   BILIRUBINUR NEGATIVE 04/09/2023 0045   BILIRUBINUR Negative 10/15/2020 0947   KETONESUR NEGATIVE 04/09/2023 0045   PROTEINUR NEGATIVE 04/09/2023 0045   UROBILINOGEN >8.0 (H) 05/06/2011 2316   NITRITE NEGATIVE 04/09/2023 0045   LEUKOCYTESUR NEGATIVE 04/09/2023 0045   Sepsis Labs Recent  Labs  Lab 04/09/23 0021 04/09/23 0608  WBC 7.5 7.4   Microbiology No results found for this or any previous visit (from the past 240 hour(s)).   Time coordinating discharge: Over 30 minutes  SIGNED:   Azucena Fallen, DO [facility] 04/09/2023, 5:59 PM Pager   If 7PM-7AM, please contact night-coverage [email or web address]

## 2023-04-09 NOTE — ED Notes (Signed)
Paramedic notified of I-stat critical.

## 2023-04-09 NOTE — ED Notes (Signed)
Pt. Complaining of pain in the left forearm IV. Third line started. Continued K+ and lactated ringers in the hand line.

## 2023-04-11 ENCOUNTER — Telehealth: Payer: Self-pay

## 2023-04-11 NOTE — Transitions of Care (Post Inpatient/ED Visit) (Signed)
   04/11/2023  Name: Jeffrey Potts MRN: [id number] DOB: February 15, 1971  Today's TOC FU Call Status: Today's TOC FU Call Status:: Unsuccessful Call (1st Attempt) Unsuccessful Call (1st Attempt) Date: 04/11/23  Attempted to reach the patient regarding the most recent Inpatient/ED visit.  Follow Up Plan: Additional outreach attempts will be made to reach the patient to complete the Transitions of Care (Post Inpatient/ED visit) call.   Alyse Low, RN, BA, [facility], CRRN [facility] Coordinator, Transition of Care Ph # [phone]

## 2023-04-23 ENCOUNTER — Other Ambulatory Visit: Payer: Self-pay

## 2023-04-23 ENCOUNTER — Encounter (HOSPITAL_COMMUNITY): Payer: Self-pay | Admitting: *Deleted

## 2023-04-23 ENCOUNTER — Emergency Department (HOSPITAL_COMMUNITY)
Admission: EM | Admit: 2023-04-23 | Discharge: 2023-04-23 | Disposition: A | Discharge status: Home / Self Care | Payer: Self-pay | Attending: Emergency Medicine | Admitting: Emergency Medicine

## 2023-04-23 DIAGNOSIS — Z7984 Long term (current) use of oral hypoglycemic drugs: Secondary | ICD-10-CM | POA: Insufficient documentation

## 2023-04-23 DIAGNOSIS — Z794 Long term (current) use of insulin: Secondary | ICD-10-CM | POA: Insufficient documentation

## 2023-04-23 DIAGNOSIS — Z79899 Other long term (current) drug therapy: Secondary | ICD-10-CM | POA: Insufficient documentation

## 2023-04-23 DIAGNOSIS — Z7982 Long term (current) use of aspirin: Secondary | ICD-10-CM | POA: Insufficient documentation

## 2023-04-23 DIAGNOSIS — I10 Essential (primary) hypertension: Secondary | ICD-10-CM | POA: Insufficient documentation

## 2023-04-23 DIAGNOSIS — E119 Type 2 diabetes mellitus without complications: Secondary | ICD-10-CM | POA: Insufficient documentation

## 2023-04-23 DIAGNOSIS — K047 Periapical abscess without sinus: Secondary | ICD-10-CM | POA: Insufficient documentation

## 2023-04-23 HISTORY — DX: Type 2 diabetes mellitus without complications: E11.9

## 2023-04-23 LAB — CBG MONITORING, ED: Glucose-Capillary: 270 mg/dL — ABNORMAL HIGH (ref 70–99)

## 2023-04-23 MED ORDER — CLINDAMYCIN HCL 150 MG PO CAPS: 150.0000 mg | ORAL_CAPSULE | Freq: Four times a day (QID) | ORAL | 0 refills | Status: DC

## 2023-04-23 NOTE — Discharge Instructions (Addendum)
You have been seen today for your complaint of dental pain. Your discharge medications include clindamycin. This is an antibiotic. You should take it as prescribed. You should take it for the entire duration of the prescription. This may cause an upset stomach. This is normal. You may take this with food. You may also eat yogurt to prevent diarrhea. Follow up with: A dentist of your choice as soon as possible.  I have included a list of dentists that you may reach out to to schedule an appointment Please seek immediate medical care if you develop any of the following symptoms: You have a fever or chills. Your symptoms suddenly get worse. You have a very bad headache. You have problems breathing or swallowing. You have trouble opening your mouth. You have swelling in your neck or close to your eye. At this time there does not appear to be the presence of an emergent medical condition, however there is always the potential for conditions to change. Please read and follow the below instructions.  Do not take your medicine if  develop an itchy rash, swelling in your mouth or lips, or difficulty breathing; call 911 and seek immediate emergency medical attention if this occurs.  You may review your lab tests and imaging results in their entirety on your MyChart account.  Please discuss all results of fully with your primary care provider and other specialist at your follow-up visit.  Note: Portions of this text may have been transcribed using voice recognition software. Every effort was made to ensure accuracy; however, inadvertent computerized transcription errors may still be present.

## 2023-04-23 NOTE — ED Provider Notes (Signed)
[place] EMERGENCY DEPARTMENT AT [facility] Provider Note   CSN: [id number] Arrival date & time: 04/23/23  1944     History  Chief Complaint  Patient presents with   Facial Swelling    Jeffrey Potts is a 52 y.o. male.  With history of hypertension, diabetes presenting for evaluation of left-sided facial pain.  He states he woke up with pain to the left maxilla.  He is states he has had some swelling today as well.  He reports chewing makes the pain worse.  He denies any difficulty swallowing or breathing.  He denies any fevers or chills, nausea or vomiting, abdominal pain, chest pain, shortness of breath, sore throat.  He denies any pain with extraocular movement.  He does not remember the last time he saw a dentist.  HPI     Home Medications Prior to Admission medications   Medication Sig Start Date End Date Taking? Authorizing Provider  clindamycin (CLEOCIN) 150 MG capsule Take 1 capsule (150 mg total) by mouth every 6 (six) hours. 04/23/23  Yes Willeen Novak, Edsel Petrin, PA-C  amLODipine (NORVASC) 5 MG tablet Take 1 tablet (5 mg total) by mouth daily. 03/13/23   Zadie Rhine, MD  aspirin EC 81 MG EC tablet Take 1 tablet (81 mg total) by mouth daily. 01/26/15   Elliot Cousin, MD  Blood Glucose Monitoring Suppl DEVI 1 each by Does not apply route in the morning, at noon, and at bedtime. May substitute to any manufacturer covered by patient's insurance. 04/09/23   Azucena Fallen, MD  Glucose Blood (BLOOD GLUCOSE TEST STRIPS) STRP 1 each by In Vitro route in the morning, at noon, and at bedtime. May substitute to any manufacturer covered by patient's insurance. 04/09/23 05/09/23  Azucena Fallen, MD  insulin glargine (LANTUS) 100 UNIT/ML Solostar Pen Inject 10 Units into the skin daily. 04/09/23   Azucena Fallen, MD  Insulin Pen Needle (PEN NEEDLES) 32G X 4 MM MISC 1 each by Does not apply route 3 (three) times daily before meals. 04/09/23 05/09/23  Azucena Fallen, MD  Lancet Device MISC 1 each by Does not apply route in the morning, at noon, and at bedtime. May substitute to any manufacturer covered by patient's insurance. 04/09/23 05/09/23  Azucena Fallen, MD  Lancets Misc. MISC 1 each by Does not apply route in the morning, at noon, and at bedtime. May substitute to any manufacturer covered by patient's insurance. 04/09/23 05/09/23  Azucena Fallen, MD  lisinopril (ZESTRIL) 40 MG tablet Take 1 tablet (40 mg total) by mouth daily. 03/13/23   Zadie Rhine, MD  metFORMIN (GLUCOPHAGE) 500 MG tablet Take 2 tablets (1,000 mg total) by mouth 2 (two) times daily with a meal. Patient taking differently: Take 2,000 mg by mouth daily with breakfast. 03/18/23 04/17/23  Mesner, Barbara Cower, MD  metoprolol succinate (TOPROL XL) 25 MG 24 hr tablet Take 1 tablet (25 mg total) by mouth daily. 03/13/23   Zadie Rhine, MD  naproxen sodium (ALEVE) 220 MG tablet Take 220 mg by mouth as needed. For pain    [provider]      Allergies    Mushroom extract complex, Penicillins, and Gabapentin    Review of Systems   Review of Systems  HENT:  Positive for dental problem.   All other systems reviewed and are negative.   Physical Exam Updated Vital Signs BP (!) 180/111   Pulse 72   Temp 98.1 F (36.7 C) (Oral)  Resp 13   SpO2 98%  Physical Exam Vitals and nursing note reviewed.  Constitutional:      General: He is not in acute distress.    Appearance: Normal appearance. He is normal weight. He is not ill-appearing.  HENT:     Head: Normocephalic and atraumatic.     Mouth/Throat:     Mouth: Mucous membranes are moist.      Comments: Poor dentition throughout with numerous missing teeth and significant dental cary load.  Specifically, multiple missing teeth in the area of pain.  2 cm x 2 cm area of swelling and induration to the left anterior maxilla.  No purulent drainage able to be expressed.  No overlying erythema.  Halitosis present.   Uvula midline.  Tonsils 1+ bilaterally.  No RPA or PTA.  Soft palate rises with phonation.  No submandibular induration. Pulmonary:     Effort: Pulmonary effort is normal. No respiratory distress.  Abdominal:     General: Abdomen is flat.  Musculoskeletal:        General: Normal range of motion.     Cervical back: Neck supple.  Skin:    General: Skin is warm and dry.  Neurological:     Mental Status: He is alert and oriented to person, place, and time.  Psychiatric:        Mood and Affect: Mood normal.        Behavior: Behavior normal.     ED Results / Procedures / Treatments   Labs (all labs ordered are listed, but only abnormal results are displayed) Labs Reviewed  CBG MONITORING, ED - Abnormal; Notable for the following components:      Result Value   Glucose-Capillary 270 (*)    All other components within normal limits    EKG None  Radiology No results found.  Procedures Procedures    Medications Ordered in ED Medications - No data to display  ED Course/ Medical Decision Making/ A&P                                 Medical Decision Making  This patient presents to the ED for concern of dental pain, this involves an extensive number of treatment options, and is a complaint that carries with it a high risk of complications and morbidity.  The differential diagnosis includes periapical/periodontal abscess, pulpitis, facial cellulitis, Ludwig's angina  Additional history obtained from: Nursing notes from this visit.  Afebrile, significantly hypertensive.  Patient reports compliance with his home medications including amlodipine, lisinopril, metoprolol. otherwise hemodynamically stable.  52 year old male presenting for evaluation of left upper dental pain and swelling that began this morning.  On exam, there is a mild amount of swelling and induration overlying the left anterior maxilla.  He has very poor dentition with numerous eroded teeth.  No purulent drainage  is able to be expressed.  Overall suspect a periodontal infection as the cause of his pain and swelling likely secondary to his poor dentition.  No evidence of Ludwig's angina at this time.  There was some concern for angioedema given that he is on lisinopril, however there is no glossitis, lip swelling, and the symptoms are localized to the area of question.  Patient has an anaphylactic allergy to penicillins so will treat with clindamycin and give patient a resource guide for dentists in the area.  Patient reports his blood pressure is very labile.  It is not uncommon for  his blood pressure to be greater than 200 systolic.  He denies any vision changes, numbness, weakness, chest pain, shortness of breath.  Nursing staff also checked a capillary blood sugar in triage.  This was found to be 270.  This is near his baseline and unrelated to his presentation today.  He reports that his insulin and glucometer have not been filled by [facility] yet and they have been sent a week ago.  He was encouraged to check again in 2 begin taking his medications as soon as he is able to pick them up.  He was encouraged to follow-up with his primary care provider soon as possible for his blood pressure and for follow-up on his dental pain.  He was given return precautions.  Stable at discharge.  At this time there does not appear to be any evidence of an acute emergency medical condition and the patient appears stable for discharge with appropriate outpatient follow up. Diagnosis was discussed with patient who verbalizes understanding of care plan and is agreeable to discharge. I have discussed return precautions with patient who verbalizes understanding. Patient encouraged to follow-up with their PCP within 1 week. All questions answered.  Note: Portions of this report may have been transcribed using voice recognition software. Every effort was made to ensure accuracy; however, inadvertent computerized transcription errors may  still be present.        Final Clinical Impression(s) / ED Diagnoses Final diagnoses:  Dental infection  Uncontrolled hypertension    Rx / DC Orders ED Discharge Orders          Ordered    clindamycin (CLEOCIN) 150 MG capsule  Every 6 hours        04/23/23 2122              Mora Bellman 04/23/23 2123    Glyn Ade, MD 04/24/23 [phone]

## 2023-04-23 NOTE — ED Triage Notes (Signed)
Pt states that when he woke up this morning he had some swelling on the left side of his face that has increased today. (Swelling from left of nose down through the upper cheek). Pt is hypertensive in triage, says he is taking his blood pressure medications but he has not gotten his insulin filled.

## 2024-03-25 ENCOUNTER — Other Ambulatory Visit: Payer: Self-pay

## 2024-03-25 ENCOUNTER — Emergency Department (HOSPITAL_COMMUNITY)
Admission: EM | Admit: 2024-03-25 | Discharge: 2024-03-25 | Disposition: A | Discharge status: Home / Self Care | Payer: Self-pay

## 2024-03-25 ENCOUNTER — Encounter (HOSPITAL_COMMUNITY): Payer: Self-pay | Admitting: Emergency Medicine

## 2024-03-25 DIAGNOSIS — I10 Essential (primary) hypertension: Secondary | ICD-10-CM | POA: Insufficient documentation

## 2024-03-25 DIAGNOSIS — R739 Hyperglycemia, unspecified: Secondary | ICD-10-CM

## 2024-03-25 DIAGNOSIS — Z7982 Long term (current) use of aspirin: Secondary | ICD-10-CM | POA: Insufficient documentation

## 2024-03-25 DIAGNOSIS — E1165 Type 2 diabetes mellitus with hyperglycemia: Secondary | ICD-10-CM | POA: Insufficient documentation

## 2024-03-25 DIAGNOSIS — Z7984 Long term (current) use of oral hypoglycemic drugs: Secondary | ICD-10-CM | POA: Insufficient documentation

## 2024-03-25 DIAGNOSIS — Z79899 Other long term (current) drug therapy: Secondary | ICD-10-CM | POA: Insufficient documentation

## 2024-03-25 LAB — CBC WITH DIFFERENTIAL/PLATELET
Abs Immature Granulocytes: 0.04 K/uL (ref 0.00–0.07)
Basophils Absolute: 0 K/uL (ref 0.0–0.1)
Basophils Relative: 1 %
Eosinophils Absolute: 0.2 K/uL (ref 0.0–0.5)
Eosinophils Relative: 3 %
HCT: 47.5 % (ref 39.0–52.0)
Hemoglobin: 16.8 g/dL (ref 13.0–17.0)
Immature Granulocytes: 1 %
Lymphocytes Relative: 26 %
Lymphs Abs: 2.2 K/uL (ref 0.7–4.0)
MCH: 29 pg (ref 26.0–34.0)
MCHC: 35.4 g/dL (ref 30.0–36.0)
MCV: 82 fL (ref 80.0–100.0)
Monocytes Absolute: 0.6 K/uL (ref 0.1–1.0)
Monocytes Relative: 7 %
Neutro Abs: 5.4 K/uL (ref 1.7–7.7)
Neutrophils Relative %: 62 %
Platelets: 222 K/uL (ref 150–400)
RBC: 5.79 MIL/uL (ref 4.22–5.81)
RDW: 13.1 % (ref 11.5–15.5)
WBC: 8.4 K/uL (ref 4.0–10.5)
nRBC: 0 % (ref 0.0–0.2)

## 2024-03-25 LAB — URINALYSIS, ROUTINE W REFLEX MICROSCOPIC
Bacteria, UA: NONE SEEN
Bilirubin Urine: NEGATIVE
Glucose, UA: >=500 mg/dL — AB
Ketones, ur: 20 mg/dL — AB
Leukocytes,Ua: NEGATIVE
Nitrite: NEGATIVE
Protein, ur: 100 mg/dL — AB
Specific Gravity, Urine: 1.027 (ref 1.005–1.030)
pH: 5 (ref 5.0–8.0)

## 2024-03-25 LAB — BASIC METABOLIC PANEL WITH GFR
Anion gap: 11 (ref 5–15)
BUN: 20 mg/dL (ref 6–20)
CO2: 20 mmol/L — ABNORMAL LOW (ref 22–32)
Calcium: 8.9 mg/dL (ref 8.9–10.3)
Chloride: 103 mmol/L (ref 98–111)
Creatinine, Ser: 0.96 mg/dL (ref 0.61–1.24)
GFR, Estimated: >60 mL/min (ref >60)
Glucose, Bld: 270 mg/dL — ABNORMAL HIGH (ref 70–99)
Potassium: 3.7 mmol/L (ref 3.5–5.1)
Sodium: 134 mmol/L — ABNORMAL LOW (ref 135–145)

## 2024-03-25 LAB — CBG MONITORING, ED
Glucose-Capillary: 243 mg/dL — ABNORMAL HIGH (ref 70–99)
Glucose-Capillary: 230 mg/dL — ABNORMAL HIGH (ref 70–99)

## 2024-03-25 LAB — TROPONIN I (HIGH SENSITIVITY)
Troponin I (High Sensitivity): 12 ng/L (ref <18)
Troponin I (High Sensitivity): 12 ng/L (ref <18)

## 2024-03-25 MED ORDER — AMLODIPINE BESYLATE 5 MG PO TABS: 5.0000 mg | ORAL_TABLET | Freq: Every day | ORAL | 0 refills | Status: DC

## 2024-03-25 MED ORDER — INSULIN ASPART 100 UNIT/ML IJ SOLN
10.0000 [IU] | Freq: Once | INTRAMUSCULAR | Status: DC
  Filled 2024-03-25: qty 0.1

## 2024-03-25 MED ORDER — AMLODIPINE BESYLATE 5 MG PO TABS
5.0000 mg | ORAL_TABLET | Freq: Once | ORAL | Status: AC
  Administered 2024-03-25: 5 mg via ORAL
  Filled 2024-03-25: qty 1

## 2024-03-25 MED ORDER — METFORMIN HCL 500 MG PO TABS: 500.0000 mg | ORAL_TABLET | Freq: Two times a day (BID) | ORAL | 0 refills | Status: AC

## 2024-03-25 MED ORDER — METFORMIN HCL 500 MG PO TABS
500.0000 mg | ORAL_TABLET | Freq: Once | ORAL | Status: AC
  Administered 2024-03-25: 500 mg via ORAL
  Filled 2024-03-25: qty 1

## 2024-03-25 MED ORDER — LACTATED RINGERS IV BOLUS
1000.0000 mL | Freq: Once | INTRAVENOUS | Status: AC
  Administered 2024-03-25: 1000 mL via INTRAVENOUS

## 2024-03-25 NOTE — ED Provider Notes (Signed)
 [place] EMERGENCY DEPARTMENT AT [facility] Provider Note   CSN: [id number] Arrival date & time: 03/25/24  1849     Patient presents with: Dehydration   Jeffrey Potts is a 53 y.o. male.   53 year old male presents for evaluation of dehydration.  States he has been very thirsty and urinating quite frequently and feels like his mouth is very dry.  States last time this happened he was found of hypoglycemia.  States he does not take anything for his diabetes or any of his other medical problems because he does not have insurance and does not regularly follow-up with primary care.  He denies any other symptoms or concerns at this time.        Prior to Admission medications   Medication Sig Start Date End Date Taking? Authorizing Provider  amLODipine  (NORVASC ) 5 MG tablet Take 1 tablet (5 mg total) by mouth daily. 03/25/24  Yes Lachina Salsberry L, DO  metFORMIN  (GLUCOPHAGE ) 500 MG tablet Take 1 tablet (500 mg total) by mouth 2 (two) times daily with a meal. 03/25/24 04/24/24 Yes Percival Glasheen L, DO  amLODipine  (NORVASC ) 5 MG tablet Take 1 tablet (5 mg total) by mouth daily. 03/13/23   Midge Golas, MD  aspirin  EC 81 MG EC tablet Take 1 tablet (81 mg total) by mouth daily. 01/26/15   Gasper Aquas, MD  Blood Glucose Monitoring Suppl DEVI 1 each by Does not apply route in the morning, at noon, and at bedtime. May substitute to any manufacturer covered by patient's insurance. 04/09/23   Lue Elsie BROCKS, MD  clindamycin  (CLEOCIN ) 150 MG capsule Take 1 capsule (150 mg total) by mouth every 6 (six) hours. 04/23/23   Schutt, Marsa HERO, PA-C  insulin  glargine (LANTUS ) 100 UNIT/ML Solostar Pen Inject 10 Units into the skin daily. 04/09/23   Lue Elsie BROCKS, MD  lisinopril  (ZESTRIL ) 40 MG tablet Take 1 tablet (40 mg total) by mouth daily. 03/13/23   Midge Golas, MD  metFORMIN  (GLUCOPHAGE ) 500 MG tablet Take 2 tablets (1,000 mg total) by mouth 2 (two) times daily with  a meal. Patient taking differently: Take 2,000 mg by mouth daily with breakfast. 03/18/23 04/17/23  Mesner, Selinda, MD  metoprolol  succinate (TOPROL  XL) 25 MG 24 hr tablet Take 1 tablet (25 mg total) by mouth daily. 03/13/23   Midge Golas, MD  naproxen sodium (ALEVE) 220 MG tablet Take 220 mg by mouth as needed. For pain    [provider]    Allergies: Mushroom extract complex (obsolete), Penicillins, and Gabapentin    Review of Systems  Constitutional:  Positive for fatigue. Negative for chills and fever.  HENT:  Negative for ear pain and sore throat.   Eyes:  Negative for pain and visual disturbance.  Respiratory:  Negative for cough and shortness of breath.   Cardiovascular:  Negative for chest pain and palpitations.  Gastrointestinal:  Negative for abdominal pain and vomiting.  Endocrine: Positive for polydipsia and polyuria.  Genitourinary:  Negative for dysuria and hematuria.  Musculoskeletal:  Negative for arthralgias and back pain.  Skin:  Negative for color change and rash.  Neurological:  Negative for seizures and syncope.  All other systems reviewed and are negative.   Updated Vital Signs BP (!) 214/114   Pulse 87   Temp 98.2 F (36.8 C) (Oral)   Resp 17   Ht 5' 10 (1.778 m)   Wt 108.9 kg   SpO2 98%   BMI 34.44 kg/m   Physical  Exam Vitals and nursing note reviewed.  Constitutional:      General: He is not in acute distress.    Appearance: He is well-developed.  HENT:     Head: Normocephalic and atraumatic.     Mouth/Throat:     Comments: Dry mucous membranes  Eyes:     Conjunctiva/sclera: Conjunctivae normal.  Cardiovascular:     Rate and Rhythm: Regular rhythm. Tachycardia present.     Heart sounds: No murmur heard. Pulmonary:     Effort: Pulmonary effort is normal. No respiratory distress.     Breath sounds: Normal breath sounds.  Abdominal:     Palpations: Abdomen is soft.     Tenderness: There is no abdominal tenderness.   Musculoskeletal:        General: No swelling.     Cervical back: Neck supple.  Skin:    General: Skin is warm and dry.     Capillary Refill: Capillary refill takes less than 2 seconds.  Neurological:     Mental Status: He is alert.  Psychiatric:        Mood and Affect: Mood normal.     (all labs ordered are listed, but only abnormal results are displayed) Labs Reviewed  BASIC METABOLIC PANEL WITH GFR - Abnormal; Notable for the following components:      Result Value   Sodium 134 (*)    CO2 20 (*)    Glucose, Bld 270 (*)    All other components within normal limits  URINALYSIS, ROUTINE W REFLEX MICROSCOPIC - Abnormal; Notable for the following components:   Glucose, UA >=500 (*)    Hgb urine dipstick SMALL (*)    Ketones, ur 20 (*)    Protein, ur 100 (*)    All other components within normal limits  CBG MONITORING, ED - Abnormal; Notable for the following components:   Glucose-Capillary 243 (*)    All other components within normal limits  CBG MONITORING, ED - Abnormal; Notable for the following components:   Glucose-Capillary 230 (*)    All other components within normal limits  CBC WITH DIFFERENTIAL/PLATELET  TROPONIN I (HIGH SENSITIVITY)  TROPONIN I (HIGH SENSITIVITY)    EKG: EKG Interpretation Date/Time:  Sunday March 25 2024 19:10:43 EDT Ventricular Rate:  95 PR Interval:  194 QRS Duration:  107 QT Interval:  354 QTC Calculation: 445 R Axis:   114  Text Interpretation: Right and left arm electrode reversal, interpretation assumes no reversal Sinus rhythm Probable left atrial enlargement Probable lateral infarct, age indeterminate Compared with prior EKG from 03/17/2023 Confirmed by Gennaro Bouchard ([id number]) on 03/25/2024 7:36:15 PM  Radiology: No results found.   Procedures   Medications Ordered in the ED  lactated ringers  bolus 1,000 mL (0 mLs Intravenous Stopped 03/25/24 2215)  metFORMIN  (GLUCOPHAGE ) tablet 500 mg (500 mg Oral Given 03/25/24 2135)   amLODipine  (NORVASC ) tablet 5 mg (5 mg Oral Given 03/25/24 2135)                                    Medical Decision Making Cardiac monitor interpretation: Sinus rhythm, no ectopy  Social determinants of health: Patient has poor follow-up, does not have insurance  Patient here for hyperglycemia.  Glucose in the 200s to 300s.  Was given IV fluids and glucose improved.  Troponin is negative and labs otherwise fairly unremarkable, there is no anion gap and electrolytes are within normal limits.  I  will start him on metformin .  He is also found to be hypertensive but is asymptomatic from this.  It sounds like he has a history of hypertension diabetes but she does not follow-up and does not take more than a month of medications that he is prescribed from the ER or hospital.  I will give him prescription for metformin  and amlodipine  and he will take these.  I did emphasize the importance of outpatient follow-up and continuation of these medications as well as management of these chronic conditions.  He is agreeable with plan for discharge.  Problems Addressed: Hyperglycemia: acute illness or injury Hypertension, unspecified type: chronic illness or injury  Amount and/or Complexity of Data Reviewed External Data Reviewed: notes.    Details: Prior ED and hospital records reviewed and patient has been admitted for hypokalemia and hyperglycemia in the past Labs: ordered. Decision-making details documented in ED Course.    Details: Ordered and reviewed by me and patient with hyperglycemia but labs fairly unremarkable otherwise, troponins are negative ECG/medicine tests: ordered and independent interpretation performed. Decision-making details documented in ED Course.    Details: EKG ordered and interpreted by me in absence of cardiology and shows sinus rhythm, no STEMI or acute change when compared to prior  Risk OTC drugs. Prescription drug management. Drug therapy requiring intensive monitoring  for toxicity. Diagnosis or treatment significantly limited by social determinants of health.     Final diagnoses:  Hyperglycemia  Hypertension, unspecified type    ED Discharge Orders          Ordered    metFORMIN  (GLUCOPHAGE ) 500 MG tablet  2 times daily with meals        03/25/24 2203    amLODipine  (NORVASC ) 5 MG tablet  Daily        03/25/24 2203               Gennaro Duwaine CROME, DO 03/25/24 2234

## 2024-03-25 NOTE — ED Triage Notes (Signed)
 Pt via POV from home c/o dehydration with frequent urination and increased thirst. Pt notes he has not been able to afford medications including multiple meds for HTN. No hx DM2 per pt. BP 227/133 (162) at time of triage. Pt has felt dizzy but denies headache and blurred vision. Ambulatory w/o difficulty. Prior episodes of severe hypokalemia.

## 2024-03-25 NOTE — Discharge Instructions (Addendum)
 Take your metformin  and amlodipine  as prescribed.  Is important that you call and follow-up with your primary care doctor for refill of your medications and further management of your diabetes and your high blood pressure.

## 2024-04-06 ENCOUNTER — Encounter (HOSPITAL_COMMUNITY): Payer: Self-pay | Admitting: Emergency Medicine

## 2024-04-06 ENCOUNTER — Inpatient Hospital Stay (HOSPITAL_COMMUNITY)
Admission: EM | Admit: 2024-04-06 | Discharge: 2024-04-09 | DRG: 639 | Disposition: A | Discharge status: Home / Self Care | Payer: Self-pay | Attending: Internal Medicine | Admitting: Internal Medicine

## 2024-04-06 ENCOUNTER — Other Ambulatory Visit: Payer: Self-pay

## 2024-04-06 DIAGNOSIS — Z823 Family history of stroke: Secondary | ICD-10-CM

## 2024-04-06 DIAGNOSIS — T464X6A Underdosing of angiotensin-converting-enzyme inhibitors, initial encounter: Secondary | ICD-10-CM | POA: Diagnosis present

## 2024-04-06 DIAGNOSIS — Z7984 Long term (current) use of oral hypoglycemic drugs: Secondary | ICD-10-CM

## 2024-04-06 DIAGNOSIS — I10 Essential (primary) hypertension: Secondary | ICD-10-CM | POA: Diagnosis present

## 2024-04-06 DIAGNOSIS — Z888 Allergy status to other drugs, medicaments and biological substances status: Secondary | ICD-10-CM

## 2024-04-06 DIAGNOSIS — T447X6A Underdosing of beta-adrenoreceptor antagonists, initial encounter: Secondary | ICD-10-CM | POA: Diagnosis present

## 2024-04-06 DIAGNOSIS — E1100 Type 2 diabetes mellitus with hyperosmolarity without nonketotic hyperglycemic-hyperosmolar coma (NKHHC): Principal | ICD-10-CM | POA: Diagnosis present

## 2024-04-06 DIAGNOSIS — Z7982 Long term (current) use of aspirin: Secondary | ICD-10-CM

## 2024-04-06 DIAGNOSIS — I160 Hypertensive urgency: Secondary | ICD-10-CM | POA: Diagnosis present

## 2024-04-06 DIAGNOSIS — Z9102 Food additives allergy status: Secondary | ICD-10-CM

## 2024-04-06 DIAGNOSIS — Z880 Allergy status to penicillin: Secondary | ICD-10-CM

## 2024-04-06 DIAGNOSIS — E1110 Type 2 diabetes mellitus with ketoacidosis without coma: Secondary | ICD-10-CM

## 2024-04-06 DIAGNOSIS — E876 Hypokalemia: Secondary | ICD-10-CM | POA: Diagnosis present

## 2024-04-06 DIAGNOSIS — Z79899 Other long term (current) drug therapy: Secondary | ICD-10-CM

## 2024-04-06 DIAGNOSIS — Z794 Long term (current) use of insulin: Secondary | ICD-10-CM

## 2024-04-06 DIAGNOSIS — Z6835 Body mass index (BMI) 35.0-35.9, adult: Secondary | ICD-10-CM

## 2024-04-06 DIAGNOSIS — Z91128 Patient's intentional underdosing of medication regimen for other reason: Secondary | ICD-10-CM

## 2024-04-06 DIAGNOSIS — Z825 Family history of asthma and other chronic lower respiratory diseases: Secondary | ICD-10-CM

## 2024-04-06 DIAGNOSIS — E669 Obesity, unspecified: Secondary | ICD-10-CM | POA: Diagnosis present

## 2024-04-06 DIAGNOSIS — Z800 Family history of malignant neoplasm of digestive organs: Secondary | ICD-10-CM

## 2024-04-06 LAB — COMPREHENSIVE METABOLIC PANEL WITH GFR
ALT: 43 U/L (ref 0–44)
AST: 20 U/L (ref 15–41)
Albumin: 4.2 g/dL (ref 3.5–5.0)
Alkaline Phosphatase: 147 U/L — ABNORMAL HIGH (ref 38–126)
Anion gap: 18 — ABNORMAL HIGH (ref 5–15)
BUN: 13 mg/dL (ref 6–20)
CO2: 18 mmol/L — ABNORMAL LOW (ref 22–32)
Calcium: 9.8 mg/dL (ref 8.9–10.3)
Chloride: 95 mmol/L — ABNORMAL LOW (ref 98–111)
Creatinine, Ser: 1.24 mg/dL (ref 0.61–1.24)
GFR, Estimated: >60 mL/min (ref >60)
Glucose, Bld: 652 mg/dL (ref 70–99)
Potassium: 3.6 mmol/L (ref 3.5–5.1)
Sodium: 130 mmol/L — ABNORMAL LOW (ref 135–145)
Total Bilirubin: 0.9 mg/dL (ref 0.0–1.2)
Total Protein: 7.1 g/dL (ref 6.5–8.1)

## 2024-04-06 LAB — CBG MONITORING, ED: Glucose-Capillary: >600 mg/dL (ref 70–99)

## 2024-04-06 LAB — CBC
HCT: 48.9 % (ref 39.0–52.0)
Hemoglobin: 16.6 g/dL (ref 13.0–17.0)
MCH: 28.2 pg (ref 26.0–34.0)
MCHC: 33.9 g/dL (ref 30.0–36.0)
MCV: 83 fL (ref 80.0–100.0)
Platelets: 243 K/uL (ref 150–400)
RBC: 5.89 MIL/uL — ABNORMAL HIGH (ref 4.22–5.81)
RDW: 13.2 % (ref 11.5–15.5)
WBC: 7.8 K/uL (ref 4.0–10.5)
nRBC: 0 % (ref 0.0–0.2)

## 2024-04-06 LAB — BLOOD GAS, VENOUS
Acid-base deficit: 1.7 mmol/L (ref 0.0–2.0)
Bicarbonate: 23 mmol/L (ref 20.0–28.0)
O2 Saturation: 91 %
Patient temperature: 37
pCO2, Ven: 38 mmHg — ABNORMAL LOW (ref 44–60)
pH, Ven: 7.39 (ref 7.25–7.43)
pO2, Ven: 59 mmHg — ABNORMAL HIGH (ref 32–45)

## 2024-04-06 LAB — MAGNESIUM: Magnesium: 2.1 mg/dL (ref 1.7–2.4)

## 2024-04-06 MED ORDER — LACTATED RINGERS IV SOLN: INTRAVENOUS | Status: AC

## 2024-04-06 MED ORDER — DEXTROSE 50 % IV SOLN: 0.0000 mL | INTRAVENOUS | Status: DC | PRN

## 2024-04-06 MED ORDER — DEXTROSE IN LACTATED RINGERS 5 % IV SOLN: INTRAVENOUS | Status: DC

## 2024-04-06 MED ORDER — SODIUM CHLORIDE 0.9 % IV BOLUS
1000.0000 mL | Freq: Once | INTRAVENOUS | Status: AC
  Administered 2024-04-06: 1000 mL via INTRAVENOUS

## 2024-04-06 MED ORDER — INSULIN REGULAR(HUMAN) IN NACL 100-0.9 UT/100ML-% IV SOLN
INTRAVENOUS | Status: DC
  Administered 2024-04-07: 15 [IU]/h via INTRAVENOUS
  Filled 2024-04-06: qty 100

## 2024-04-06 MED ORDER — HYDRALAZINE HCL 20 MG/ML IJ SOLN
10.0000 mg | Freq: Once | INTRAMUSCULAR | Status: AC
  Administered 2024-04-06: 10 mg via INTRAVENOUS
  Filled 2024-04-06: qty 1

## 2024-04-06 MED ORDER — POTASSIUM CHLORIDE 10 MEQ/100ML IV SOLN
10.0000 meq | INTRAVENOUS | Status: AC
  Administered 2024-04-07 (×2): 10 meq via INTRAVENOUS
  Filled 2024-04-06 (×2): qty 100

## 2024-04-06 MED ORDER — ONDANSETRON HCL 4 MG/2ML IJ SOLN
4.0000 mg | Freq: Once | INTRAMUSCULAR | Status: AC
  Administered 2024-04-06: 4 mg via INTRAVENOUS
  Filled 2024-04-06: qty 2

## 2024-04-06 NOTE — ED Provider Notes (Signed)
 [place] EMERGENCY DEPARTMENT AT [facility] Provider Note   CSN: [id number] Arrival date & time: 04/06/24  2149     Patient presents with: Hyperglycemia   Jeffrey Potts is a 53 y.o. male.   The history is provided by the patient and medical records.  Hyperglycemia Associated symptoms: increased thirst and polyuria    53 y.o. M with hx of HTN, DM, presenting to the ED for hyperglycemia, urinary frequency and increased thirst for the past several days.  Patient reports he has had this multiple times in the past, feels like his blood sugar is out of whack.  He was seen here last week and started on metformin  but feels like it is not adequately controlling his symptoms.  He has tried changing up his diet, increasing water intake, drinking to reduce sugar drinks but still having symptoms.  Now to the point he is feeling lightheaded but has not had any syncopal events or loss of consciousness.  Also reports he was started on amlodipine , however was supposed to be on lisinopril  and metoprolol  as well.  States he has not had those 2 medications in about 1 year.  States his BP has been difficult to control in the past.  He denies any chest pain, shortness of breath, headaches, etc.  Prior to Admission medications   Medication Sig Start Date End Date Taking? Authorizing Provider  amLODipine  (NORVASC ) 5 MG tablet Take 1 tablet (5 mg total) by mouth daily. 03/13/23   Midge Golas, MD  amLODipine  (NORVASC ) 5 MG tablet Take 1 tablet (5 mg total) by mouth daily. 03/25/24   Kammerer, Megan L, DO  aspirin  EC 81 MG EC tablet Take 1 tablet (81 mg total) by mouth daily. 01/26/15   Gasper Aquas, MD  Blood Glucose Monitoring Suppl DEVI 1 each by Does not apply route in the morning, at noon, and at bedtime. May substitute to any manufacturer covered by patient's insurance. 04/09/23   Lue Elsie BROCKS, MD  clindamycin  (CLEOCIN ) 150 MG capsule Take 1 capsule (150 mg total) by mouth every 6  (six) hours. 04/23/23   Schutt, Marsa HERO, PA-C  insulin  glargine (LANTUS ) 100 UNIT/ML Solostar Pen Inject 10 Units into the skin daily. 04/09/23   Lue Elsie BROCKS, MD  lisinopril  (ZESTRIL ) 40 MG tablet Take 1 tablet (40 mg total) by mouth daily. 03/13/23   Midge Golas, MD  metFORMIN  (GLUCOPHAGE ) 500 MG tablet Take 2 tablets (1,000 mg total) by mouth 2 (two) times daily with a meal. Patient taking differently: Take 2,000 mg by mouth daily with breakfast. 03/18/23 04/17/23  Mesner, Selinda, MD  metFORMIN  (GLUCOPHAGE ) 500 MG tablet Take 1 tablet (500 mg total) by mouth 2 (two) times daily with a meal. 03/25/24 04/24/24  Kammerer, Megan L, DO  metoprolol  succinate (TOPROL  XL) 25 MG 24 hr tablet Take 1 tablet (25 mg total) by mouth daily. 03/13/23   Midge Golas, MD  naproxen  sodium (ALEVE ) 220 MG tablet Take 220 mg by mouth as needed. For pain    [provider]    Allergies: Mushroom extract complex (obsolete), Penicillins, and Gabapentin    Review of Systems  Endocrine: Positive for polydipsia and polyuria.  All other systems reviewed and are negative.   Updated Vital Signs BP (!) 194/124   Pulse (!) 124   Temp 98.3 F (36.8 C) (Oral)   Resp 16   SpO2 97%   Physical Exam Vitals and nursing note reviewed.  Constitutional:  Appearance: He is well-developed. He is obese.  HENT:     Head: Normocephalic and atraumatic.  Eyes:     Conjunctiva/sclera: Conjunctivae normal.     Pupils: Pupils are equal, round, and reactive to light.  Cardiovascular:     Rate and Rhythm: Regular rhythm. Tachycardia present.     Heart sounds: Normal heart sounds.     Comments: Tachy 110's during exam Pulmonary:     Effort: Pulmonary effort is normal. No respiratory distress.     Breath sounds: Normal breath sounds. No rhonchi.  Abdominal:     General: Bowel sounds are normal.     Palpations: Abdomen is soft.  Musculoskeletal:        General: Normal range of motion.     Cervical  back: Normal range of motion.  Skin:    General: Skin is warm and dry.  Neurological:     Mental Status: He is alert and oriented to person, place, and time.     (all labs ordered are listed, but only abnormal results are displayed) Labs Reviewed  CBC - Abnormal; Notable for the following components:      Result Value   RBC 5.89 (*)    All other components within normal limits  COMPREHENSIVE METABOLIC PANEL WITH GFR - Abnormal; Notable for the following components:   Sodium 130 (*)    Chloride 95 (*)    CO2 18 (*)    Glucose, Bld 652 (*)    Alkaline Phosphatase 147 (*)    Anion gap 18 (*)    All other components within normal limits  BLOOD GAS, VENOUS - Abnormal; Notable for the following components:   pCO2, Ven 38 (*)    pO2, Ven 59 (*)    All other components within normal limits  CBG MONITORING, ED - Abnormal; Notable for the following components:   Glucose-Capillary >600 (*)    All other components within normal limits  CBG MONITORING, ED - Abnormal; Notable for the following components:   Glucose-Capillary >600 (*)    All other components within normal limits  MAGNESIUM  URINALYSIS, ROUTINE W REFLEX MICROSCOPIC  BETA-HYDROXYBUTYRIC ACID  HEMOGLOBIN A1C    EKG: None  Radiology: No results found.   Procedures   CRITICAL CARE Performed by: Olam CHRISTELLA Sloc[place]   Total critical care time: 45 minutes  Critical care time was exclusive of separately billable procedures and treating other patients.  Critical care was necessary to treat or prevent imminent or life-threatening deterioration.  Critical care was time spent personally by me on the following activities: development of treatment plan with patient and/or surrogate as well as nursing, discussions with consultants, evaluation of patient's response to treatment, examination of patient, obtaining history from patient or surrogate, ordering and performing treatments and interventions, ordering and review of  laboratory studies, ordering and review of radiographic studies, pulse oximetry and re-evaluation of patient's condition.   Medications Ordered in the ED  insulin  regular, human (MYXREDLIN ) 100 units/ 100 mL infusion (has no administration in time range)  lactated ringers  infusion (has no administration in time range)  dextrose  5 % in lactated ringers  infusion (0 mLs Intravenous Hold 04/06/24 2346)  dextrose  50 % solution 0-50 mL (has no administration in time range)  potassium chloride  10 mEq in 100 mL IVPB (has no administration in time range)  sodium chloride  0.9 % bolus 1,000 mL (1,000 mLs Intravenous New Bag/Given 04/06/24 2303)  ondansetron  (ZOFRAN ) injection 4 mg (4 mg Intravenous Given 04/06/24 2304)  hydrALAZINE  (APRESOLINE ) injection 10 mg (10 mg Intravenous Given 04/06/24 2304)                                    Medical Decision Making Amount and/or Complexity of Data Reviewed Labs: ordered. ECG/medicine tests: ordered and independent interpretation performed.  Risk Prescription drug management. Decision regarding hospitalization.   54 year old male presenting to the ED with hyperglycemia.  Recently started on metformin  but has not been really adequately controlling his symptoms.  Admits to polyuria and polydipsia.  He is also very hypertensive here--only currently on amlodipine , has not had his lisinopril  or metoprolol  in about 1 year.    He is afebrile and nontoxic in appearance here.  He is tachycardic and hypertensive but otherwise hemodynamically stable.  Initial glucose >600.  Labs as above--hyperglycemic with glucose of 652, does have some mild electrolyte derangements, anion gap 18.  Fortunately, pH is normal.  Seems more consistent with HHS.  Given IV fluids, started insulin  drip.  BP has come down some with IV hydralazine , suspect he will need some ongoing management of this.  Plan for admission for ongoing care.  Discussed with Dr. Rosealee admit for ongoing  care.  Final diagnoses:  Type 2 diabetes mellitus with hyperosmolar hyperglycemic state (HHS) (HCC)  Hypertension, unspecified type    ED Discharge Orders     None          Jarold Olam HERO, PA-C 04/07/24 0029    Dasie Faden, MD 04/11/24 1125

## 2024-04-06 NOTE — ED Triage Notes (Signed)
 Patient c/o hyperglycemia, urinary frequency and increase thirst x 2 days. Patient report his been taking his medication regularly but unable to talked to PCP d/t Insurance problem. Patient report nausea, denies vomiting and diarrhea. Patient denies fever.

## 2024-04-07 ENCOUNTER — Encounter (HOSPITAL_COMMUNITY): Payer: Self-pay | Admitting: Family Medicine

## 2024-04-07 DIAGNOSIS — E1110 Type 2 diabetes mellitus with ketoacidosis without coma: Secondary | ICD-10-CM | POA: Insufficient documentation

## 2024-04-07 LAB — URINALYSIS, ROUTINE W REFLEX MICROSCOPIC
Bacteria, UA: NONE SEEN
Bilirubin Urine: NEGATIVE
Glucose, UA: >=500 mg/dL — AB
Ketones, ur: NEGATIVE mg/dL
Leukocytes,Ua: NEGATIVE
Nitrite: NEGATIVE
Protein, ur: NEGATIVE mg/dL
Specific Gravity, Urine: 1.027 (ref 1.005–1.030)
pH: 6 (ref 5.0–8.0)

## 2024-04-07 LAB — CBC
HCT: 45.9 % (ref 39.0–52.0)
Hemoglobin: 15.9 g/dL (ref 13.0–17.0)
MCH: 28.8 pg (ref 26.0–34.0)
MCHC: 34.6 g/dL (ref 30.0–36.0)
MCV: 83 fL (ref 80.0–100.0)
Platelets: 235 K/uL (ref 150–400)
RBC: 5.53 MIL/uL (ref 4.22–5.81)
RDW: 13.6 % (ref 11.5–15.5)
WBC: 8.4 K/uL (ref 4.0–10.5)
nRBC: 0 % (ref 0.0–0.2)

## 2024-04-07 LAB — HIV ANTIBODY (ROUTINE TESTING W REFLEX): HIV Screen 4th Generation wRfx: NONREACTIVE

## 2024-04-07 LAB — CBG MONITORING, ED
Glucose-Capillary: 583 mg/dL (ref 70–99)
Glucose-Capillary: >600 mg/dL (ref 70–99)

## 2024-04-07 LAB — BASIC METABOLIC PANEL WITH GFR
Anion gap: 12 (ref 5–15)
BUN: 15 mg/dL (ref 6–20)
CO2: 21 mmol/L — ABNORMAL LOW (ref 22–32)
Calcium: 9.6 mg/dL (ref 8.9–10.3)
Chloride: 108 mmol/L (ref 98–111)
Creatinine, Ser: 0.93 mg/dL (ref 0.61–1.24)
GFR, Estimated: >60 mL/min (ref >60)
Glucose, Bld: 108 mg/dL — ABNORMAL HIGH (ref 70–99)
Potassium: 3.6 mmol/L (ref 3.5–5.1)
Sodium: 141 mmol/L (ref 135–145)

## 2024-04-07 LAB — HEMOGLOBIN A1C
Hgb A1c MFr Bld: 9.7 % — ABNORMAL HIGH (ref 4.8–5.6)
Mean Plasma Glucose: 231.69 mg/dL

## 2024-04-07 LAB — GLUCOSE, CAPILLARY
Glucose-Capillary: 218 mg/dL — ABNORMAL HIGH (ref 70–99)
Glucose-Capillary: 123 mg/dL — ABNORMAL HIGH (ref 70–99)
Glucose-Capillary: 144 mg/dL — ABNORMAL HIGH (ref 70–99)
Glucose-Capillary: 178 mg/dL — ABNORMAL HIGH (ref 70–99)
Glucose-Capillary: 193 mg/dL — ABNORMAL HIGH (ref 70–99)
Glucose-Capillary: 352 mg/dL — ABNORMAL HIGH (ref 70–99)
Glucose-Capillary: 367 mg/dL — ABNORMAL HIGH (ref 70–99)
Glucose-Capillary: 288 mg/dL — ABNORMAL HIGH (ref 70–99)
Glucose-Capillary: 231 mg/dL — ABNORMAL HIGH (ref 70–99)

## 2024-04-07 LAB — BETA-HYDROXYBUTYRIC ACID
Beta-Hydroxybutyric Acid: 0.1 mmol/L (ref 0.05–0.27)
Beta-Hydroxybutyric Acid: 0.06 mmol/L (ref 0.05–0.27)

## 2024-04-07 MED ORDER — AMLODIPINE BESYLATE 5 MG PO TABS
5.0000 mg | ORAL_TABLET | Freq: Every day | ORAL | Status: DC
Start: 2024-04-07 — End: 2024-04-07
  Administered 2024-04-07: 5 mg via ORAL
  Filled 2024-04-07: qty 1

## 2024-04-07 MED ORDER — METOPROLOL SUCCINATE ER 25 MG PO TB24
25.0000 mg | ORAL_TABLET | Freq: Every day | ORAL | Status: DC
  Administered 2024-04-07: 25 mg via ORAL
  Filled 2024-04-07: qty 1

## 2024-04-07 MED ORDER — METFORMIN HCL 500 MG PO TABS
500.0000 mg | ORAL_TABLET | Freq: Two times a day (BID) | ORAL | Status: DC
Start: 2024-04-07 — End: 2024-04-09
  Administered 2024-04-07 – 2024-04-09 (×4): 500 mg via ORAL
  Filled 2024-04-07 (×4): qty 1

## 2024-04-07 MED ORDER — INSULIN GLARGINE 100 UNIT/ML ~~LOC~~ SOLN
10.0000 [IU] | Freq: Every day | SUBCUTANEOUS | Status: DC
  Administered 2024-04-07: 10 [IU] via SUBCUTANEOUS
  Filled 2024-04-07: qty 0.1

## 2024-04-07 MED ORDER — INSULIN STARTER KIT- PEN NEEDLES (ENGLISH)
1.0000 | Freq: Once | Status: AC
  Administered 2024-04-07: 1
  Filled 2024-04-07: qty 1

## 2024-04-07 MED ORDER — INSULIN GLARGINE-YFGN 100 UNIT/ML ~~LOC~~ SOLN: 10.0000 [IU] | Freq: Every day | SUBCUTANEOUS | Status: DC

## 2024-04-07 MED ORDER — TRIMETHOBENZAMIDE HCL 100 MG/ML IM SOLN: 200.0000 mg | Freq: Four times a day (QID) | INTRAMUSCULAR | Status: DC | PRN

## 2024-04-07 MED ORDER — LACTATED RINGERS IV SOLN: INTRAVENOUS | Status: DC

## 2024-04-07 MED ORDER — INSULIN ASPART PROT & ASPART (70-30 MIX) 100 UNIT/ML ~~LOC~~ SUSP
8.0000 [IU] | Freq: Two times a day (BID) | SUBCUTANEOUS | Status: DC
  Administered 2024-04-07: 8 [IU] via SUBCUTANEOUS
  Filled 2024-04-07: qty 10

## 2024-04-07 MED ORDER — AMLODIPINE BESYLATE 5 MG PO TABS: 5.0000 mg | ORAL_TABLET | Freq: Every day | ORAL | Status: DC

## 2024-04-07 MED ORDER — INSULIN ASPART 100 UNIT/ML IJ SOLN
0.0000 [IU] | Freq: Three times a day (TID) | INTRAMUSCULAR | Status: DC
  Administered 2024-04-07 (×2): 15 [IU] via SUBCUTANEOUS
  Administered 2024-04-07: 3 [IU] via SUBCUTANEOUS
  Administered 2024-04-08: 8 [IU] via SUBCUTANEOUS
  Administered 2024-04-08: 5 [IU] via SUBCUTANEOUS
  Administered 2024-04-08: 8 [IU] via SUBCUTANEOUS
  Administered 2024-04-09: 5 [IU] via SUBCUTANEOUS
  Administered 2024-04-09: 3 [IU] via SUBCUTANEOUS

## 2024-04-07 MED ORDER — LISINOPRIL 20 MG PO TABS
40.0000 mg | ORAL_TABLET | Freq: Every day | ORAL | Status: DC
  Administered 2024-04-07 – 2024-04-09 (×3): 40 mg via ORAL
  Filled 2024-04-07: qty 2
  Filled 2024-04-07 (×2): qty 4

## 2024-04-07 MED ORDER — INSULIN ASPART 100 UNIT/ML IJ SOLN
0.0000 [IU] | Freq: Every day | INTRAMUSCULAR | Status: DC
  Administered 2024-04-07: 2 [IU] via SUBCUTANEOUS
  Administered 2024-04-08: 4 [IU] via SUBCUTANEOUS

## 2024-04-07 MED ORDER — ASPIRIN 81 MG PO TBEC
81.0000 mg | DELAYED_RELEASE_TABLET | Freq: Every day | ORAL | Status: DC
  Administered 2024-04-07 – 2024-04-09 (×3): 81 mg via ORAL
  Filled 2024-04-07 (×3): qty 1

## 2024-04-07 MED ORDER — METOPROLOL SUCCINATE ER 25 MG PO TB24: 25.0000 mg | ORAL_TABLET | Freq: Every day | ORAL | Status: DC

## 2024-04-07 MED ORDER — ENOXAPARIN SODIUM 40 MG/0.4ML IJ SOSY
40.0000 mg | PREFILLED_SYRINGE | INTRAMUSCULAR | Status: DC
  Administered 2024-04-07 – 2024-04-09 (×3): 40 mg via SUBCUTANEOUS
  Filled 2024-04-07 (×3): qty 0.4

## 2024-04-07 MED ORDER — HYDRALAZINE HCL 20 MG/ML IJ SOLN
10.0000 mg | Freq: Four times a day (QID) | INTRAMUSCULAR | Status: DC | PRN
  Administered 2024-04-07 (×2): 10 mg via INTRAVENOUS
  Filled 2024-04-07 (×2): qty 1

## 2024-04-07 MED ORDER — LABETALOL HCL 5 MG/ML IV SOLN
10.0000 mg | INTRAVENOUS | Status: DC | PRN
  Administered 2024-04-07 – 2024-04-08 (×4): 10 mg via INTRAVENOUS
  Filled 2024-04-07 (×4): qty 4

## 2024-04-07 MED ORDER — AMLODIPINE BESYLATE 10 MG PO TABS: 10.0000 mg | ORAL_TABLET | Freq: Every day | ORAL | Status: DC

## 2024-04-07 MED ORDER — AMLODIPINE BESYLATE 10 MG PO TABS
10.0000 mg | ORAL_TABLET | Freq: Every day | ORAL | Status: DC
Start: 2024-04-07 — End: 2024-04-09
  Administered 2024-04-07 – 2024-04-09 (×3): 10 mg via ORAL
  Filled 2024-04-07 (×3): qty 1

## 2024-04-07 MED ORDER — ACETAMINOPHEN 325 MG PO TABS
650.0000 mg | ORAL_TABLET | Freq: Four times a day (QID) | ORAL | Status: DC | PRN
  Administered 2024-04-07 (×2): 650 mg via ORAL
  Filled 2024-04-07 (×2): qty 2

## 2024-04-07 MED ORDER — LIVING WELL WITH DIABETES BOOK
Freq: Once | Status: AC
  Filled 2024-04-07: qty 1

## 2024-04-07 MED ORDER — INSULIN GLARGINE-YFGN 100 UNIT/ML ~~LOC~~ SOLN: 15.0000 [IU] | Freq: Every day | SUBCUTANEOUS | Status: DC

## 2024-04-07 MED ORDER — HYDRALAZINE HCL 25 MG PO TABS
50.0000 mg | ORAL_TABLET | Freq: Four times a day (QID) | ORAL | Status: DC | PRN
Start: 2024-04-07 — End: 2024-04-07

## 2024-04-07 NOTE — Plan of Care (Signed)
  Problem: Fluid Volume: Goal: Ability to maintain a balanced intake and output will improve Outcome: Progressing   Problem: Nutritional: Goal: Maintenance of adequate nutrition will improve Outcome: Progressing   Problem: Skin Integrity: Goal: Risk for impaired skin integrity will decrease Outcome: Progressing

## 2024-04-07 NOTE — ED Notes (Signed)
 Pt given bedside commode

## 2024-04-07 NOTE — Inpatient Diabetes Management (Signed)
 Inpatient Diabetes Program Recommendations  AACE/ADA: New Consensus Statement on Inpatient Glycemic Control (2015)  Target Ranges:  Prepandial:   less than 140 mg/dL      Peak postprandial:   less than 180 mg/dL (1-2 hours)      Critically ill patients:  140 - 180 mg/dL   Lab Results  Component Value Date   GLUCAP 193 (H) 04/07/2024   HGBA1C 9.7 (H) 04/06/2024    Review of Glycemic Control  Diabetes history: DM2 Outpatient Diabetes medications: Lantus  10 units daily, Metformin  500 mg bid ( not taking meds x ? Months) Current orders for Inpatient glycemic control: Lantus  10 units daily Novolog  0-15 units tid, 0-5 units hs Metformin  500 mg bid   Inpatient Diabetes Program Recommendations:   Attempted to call patient on cell and room phone without answer. Reaching out to staff to assist connection with pt. (DM coordinator on weekend call working remotely). Ordered Living Well with Diabetes, transition of care consult regarding medication assistance and insulin  pen starter kit.  Since patient has no insurance, can use [employer] coupon for Humalog  75//25 insulin  ($35 per prescription for 1500 units -in 5 pens) 75/25 8 units bid would equal 11.2 units basal + 4 units meal coverage. SABRA Spoke with patient via phone. Patient never received the prescription last year for the Lantus  so never started insulin . Patient works for [employer] so unable to afford to go see his PCP and did not call back for prescription. Patient prefers to take 75/25 bid ac meals if placed on insulin . Also needs a glucose meter and supplies.  Thank you, Jeffrey Potts E. Marea Reasner, RN, MSN, CNS, CDCES  Diabetes Coordinator Inpatient Glycemic Control Team Team Pager [phone] (8am-5pm) 04/07/2024 9:32 AM

## 2024-04-07 NOTE — Progress Notes (Signed)
 PROGRESS NOTE    Jeffrey Potts  FMW:[id number] DOB: Feb 10, 1971 DOA: 04/06/2024 PCP: Dettinger, Fonda LABOR, MD    Brief Narrative:  Jeffrey Potts is a 53 y.o. male with medical history significant for hypertension and diabetes mellitus who presents with polyuria, polydipsia, lightheadedness, and leg cramps.   Patient has been experiencing polyuria and polydipsia for roughly 2 weeks and then developed lightheadedness and leg cramps last night.   He has been taking metformin  and amlodipine  at home but has been out of his other medications including lisinopril  and metoprolol  for months.    He was admitted 1 year ago for HHS and was prescribed 10 units Lantus  daily on discharge but reports that the pharmacy did not have it available and he has never taken any insulin .   Assessment and Plan: 1. HHS; type II DM  - A1c was 11.6% one year ago; he was prescribed Lantus  at that time but never started it  - After discussion with diabetic coordinator, plan to d/c on Humalog  75//25 insulin  ($35 per prescription for 1500 units -in 5 pens) 75/25 8 units bid would equal 11.2 units basal + 4 units meal coverage.    Hypertensive urgency  - BP severely elevated without target organ damage; he had been out of lisinopril  and metoprolol  - Continue Norvasc , resume metoprolol  with dose now, resume lisinopril     DVT prophylaxis: enoxaparin  (LOVENOX ) injection 40 mg Start: 04/07/24 1000    Code Status: Full Code Family Communication:   Disposition Plan:  Level of care: Telemetry Status is: Inpatient     Consultants:    Subjective:  was never able to get prescription for insulin  as he says [facility] never had  Objective: Vitals:   04/07/24 1000 04/07/24 1100 04/07/24 1151 04/07/24 1200  BP: (!) 185/97 (!) 214/129  (!) 157/76  Pulse: 82 86  93  Resp: 17 17  10   Temp:   98.4 F (36.9 C)   TempSrc:   Oral   SpO2: 94% 95%  95%  Weight:      Height:        Intake/Output Summary  (Last 24 hours) at 04/07/2024 1311 Last data filed at 04/07/2024 0800 Gross per 24 hour  Intake 2067.55 ml  Output 200 ml  Net 1867.55 ml   Filed Weights   04/07/24 0426  Weight: 112.2 kg    Examination:   General: Appearance:    Obese male in no acute distress     Lungs:      respirations unlabored  Heart:    Normal heart rate.    MS:   All extremities are intact.    Neurologic:   Awake, alert, oriented x 3. No apparent focal neurological           defect.        Data Reviewed: I have personally reviewed following labs and imaging studies  CBC: Recent Labs  Lab 04/06/24 2218 04/07/24 0709  WBC 7.8 8.4  HGB 16.6 15.9  HCT 48.9 45.9  MCV 83.0 83.0  PLT 243 235   Basic Metabolic Panel: Recent Labs  Lab 04/06/24 2218 04/06/24 2231 04/07/24 0709  NA 130*  --  141  K 3.6  --  3.6  CL 95*  --  108  CO2 18*  --  21*  GLUCOSE 652*  --  108*  BUN 13  --  15  CREATININE 1.24  --  0.93  CALCIUM 9.8  --  9.6  MG  --  2.1  --    GFR: Estimated Creatinine Clearance: 112.6 mL/min (by C-G formula based on SCr of 0.93 mg/dL). Liver Function Tests: Recent Labs  Lab 04/06/24 2218  AST 20  ALT 43  ALKPHOS 147*  BILITOT 0.9  PROT 7.1  ALBUMIN 4.2   No results for input(s): LIPASE, AMYLASE in the last 168 hours. No results for input(s): AMMONIA in the last 168 hours. Coagulation Profile: No results for input(s): INR, PROTIME in the last 168 hours. Cardiac Enzymes: No results for input(s): CKTOTAL, CKMB, CKMBINDEX, TROPONINI in the last 168 hours. BNP (last 3 results) No results for input(s): PROBNP in the last 8760 hours. HbA1C: Recent Labs    04/06/24 2220  HGBA1C 9.7*   CBG: Recent Labs  Lab 04/07/24 0628 04/07/24 0729 04/07/24 0838 04/07/24 0919 04/07/24 1132  GLUCAP 123* 144* 193* 178* 352*   Lipid Profile: No results for input(s): CHOL, HDL, LDLCALC, TRIG, CHOLHDL, LDLDIRECT in the last 72 hours. Thyroid  Function Tests: No results for input(s): TSH, T4TOTAL, FREET4, T3FREE, THYROIDAB in the last 72 hours. Anemia Panel: No results for input(s): VITAMINB12, FOLATE, FERRITIN, TIBC, IRON, RETICCTPCT in the last 72 hours. Sepsis Labs: No results for input(s): PROCALCITON, LATICACIDVEN in the last 168 hours.  No results found for this or any previous visit (from the past 240 hours).       Radiology Studies: No results found.      Scheduled Meds:  amLODipine   10 mg Oral Daily   aspirin  EC  81 mg Oral Daily   enoxaparin  (LOVENOX ) injection  40 mg Subcutaneous Q24H   insulin  aspart  0-15 Units Subcutaneous TID WC   insulin  aspart  0-5 Units Subcutaneous QHS   insulin  aspart protamine- aspart  8 Units Subcutaneous BID WC   lisinopril   40 mg Oral Daily   metFORMIN   500 mg Oral BID WC   metoprolol  succinate  25 mg Oral Daily   Continuous Infusions:  lactated ringers  Stopped (04/07/24 0520)     LOS: 0 days    Time spent: 45 minutes spent on chart review, discussion with nursing staff, consultants, updating family and interview/physical exam; more than 50% of that time was spent in counseling and/or coordination of care.    Jeffrey RAYMOND Bowl, DO [facility] Available via Epic secure chat 7am-7pm After these hours, please refer to coverage provider listed on [email or web address] 04/07/2024, 1:11 PM

## 2024-04-07 NOTE — H&P (Addendum)
 History and Physical    Evangelos Paulino FMW:[id number] DOB: Dec 25, 1970 DOA: 04/06/2024  PCP: Dettinger, Fonda LABOR, MD   Patient coming from: Home   Chief Complaint: polyuria, polydipsia, lightheadedness, leg cramps   HPI: Harlon Kutner is a 53 y.o. male with medical history significant for hypertension and diabetes mellitus who presents with polyuria, polydipsia, lightheadedness, and leg cramps.  Patient has been experiencing polyuria and polydipsia for roughly 2 weeks and then developed lightheadedness and leg cramps last night.  He has been taking metformin  and amlodipine  at home but has been out of his other medications including lisinopril  and metoprolol  for months.   He was admitted 1 year ago for HHS and was prescribed 10 units Lantus  daily on discharge but reports that the pharmacy did not have it available and he has never taken any insulin .  He denies headache, chest pain, fevers, or chills.  ED Course: Upon arrival to the ED, patient is found to be afebrile and saturating well on room air with elevated heart rate and severe hypertension.  Labs are most notable for glucose 652, bicarbonate 18, anion gap 18, normal creatinine, and normal WBC.  He was treated with a liter of NS, IV potassium, Zofran , IV hydralazine , and was started on insulin  infusion.  Review of Systems:  All other systems reviewed and apart from HPI, are negative.  Past Medical History:  Diagnosis Date   Diabetes mellitus without complication (HCC)    Hypertension    Hypertensive urgency 01/25/2015    Past Surgical History:  Procedure Laterality Date   LACERATION REPAIR     R thumb   LACERATION REPAIR  L inner thigh   SHOULDER SURGERY Right     Social History:   reports that he has never smoked. He has never used smokeless tobacco. He reports that he does not drink alcohol and does not use drugs.  Allergies  Allergen Reactions   Mushroom Extract Complex (Obsolete) Shortness Of Breath    Penicillins Other (See Comments), Anaphylaxis and Hives    Dry Throat  Other reaction(s): Other (See Comments)  Dry Throat  Dry Throat   Gabapentin Rash    Family History  Problem Relation Age of Onset   COPD Mother    Cancer Other    Cancer Father        colon   Stroke Father      Prior to Admission medications   Medication Sig Start Date End Date Taking? Authorizing Provider  amLODipine  (NORVASC ) 5 MG tablet Take 1 tablet (5 mg total) by mouth daily. 03/13/23   Midge Golas, MD  amLODipine  (NORVASC ) 5 MG tablet Take 1 tablet (5 mg total) by mouth daily. 03/25/24   Kammerer, Duwaine CROME, DO  aspirin  EC 81 MG EC tablet Take 1 tablet (81 mg total) by mouth daily. 01/26/15   Gasper Aquas, MD  Blood Glucose Monitoring Suppl DEVI 1 each by Does not apply route in the morning, at noon, and at bedtime. May substitute to any manufacturer covered by patient's insurance. 04/09/23   Lue Elsie BROCKS, MD  insulin  glargine (LANTUS ) 100 UNIT/ML Solostar Pen Inject 10 Units into the skin daily. 04/09/23   Lue Elsie BROCKS, MD  lisinopril  (ZESTRIL ) 40 MG tablet Take 1 tablet (40 mg total) by mouth daily. 03/13/23   Midge Golas, MD  metFORMIN  (GLUCOPHAGE ) 500 MG tablet Take 2 tablets (1,000 mg total) by mouth 2 (two) times daily with a meal. Patient taking differently: Take 2,000 mg by mouth  daily with breakfast. 03/18/23 04/17/23  Mesner, Selinda, MD  metFORMIN  (GLUCOPHAGE ) 500 MG tablet Take 1 tablet (500 mg total) by mouth 2 (two) times daily with a meal. 03/25/24 04/24/24  Kammerer, Megan L, DO  metoprolol  succinate (TOPROL  XL) 25 MG 24 hr tablet Take 1 tablet (25 mg total) by mouth daily. 03/13/23   Midge Golas, MD  naproxen  sodium (ALEVE ) 220 MG tablet Take 220 mg by mouth as needed. For pain    [provider]    Physical Exam: Vitals:   04/06/24 2203 04/06/24 2300  BP: (!) 194/124 (!) 194/122  Pulse: (!) 124 (!) 115  Resp: 16 18  Temp: 98.3 F (36.8 C)   TempSrc:  Oral   SpO2: 97% 96%    Constitutional: NAD, no pallor or diaphoresis   Eyes: PERTLA, lids and conjunctivae normal ENMT: Mucous membranes are dry. Posterior pharynx clear of any exudate or lesions.   Neck: supple, no masses  Respiratory: no wheezing, no crackles. No accessory muscle use.  Cardiovascular: S1 & S2 heard, regular rate and rhythm. No extremity edema.   Abdomen: No tenderness, soft. Bowel sounds active.  Musculoskeletal: no clubbing / cyanosis. No joint deformity upper and lower extremities.   Skin: Erythematous papules and plaques about the trunk and extremities. Skin is otherwise warm, dry, well-perfused. Neurologic: CN 2-12 grossly intact. Moving all extremities. Alert and oriented.  Psychiatric: Calm. Cooperative.    Labs and Imaging on Admission: I have personally reviewed following labs and imaging studies  CBC: Recent Labs  Lab 04/06/24 2218  WBC 7.8  HGB 16.6  HCT 48.9  MCV 83.0  PLT 243   Basic Metabolic Panel: Recent Labs  Lab 04/06/24 2218 04/06/24 2231  NA 130*  --   K 3.6  --   CL 95*  --   CO2 18*  --   GLUCOSE 652*  --   BUN 13  --   CREATININE 1.24  --   CALCIUM 9.8  --   MG  --  2.1   GFR: Estimated Creatinine Clearance: 83.2 mL/min (by C-G formula based on SCr of 1.24 mg/dL). Liver Function Tests: Recent Labs  Lab 04/06/24 2218  AST 20  ALT 43  ALKPHOS 147*  BILITOT 0.9  PROT 7.1  ALBUMIN 4.2   No results for input(s): LIPASE, AMYLASE in the last 168 hours. No results for input(s): AMMONIA in the last 168 hours. Coagulation Profile: No results for input(s): INR, PROTIME in the last 168 hours. Cardiac Enzymes: No results for input(s): CKTOTAL, CKMB, CKMBINDEX, TROPONINI in the last 168 hours. BNP (last 3 results) No results for input(s): PROBNP in the last 8760 hours. HbA1C: No results for input(s): HGBA1C in the last 72 hours. CBG: Recent Labs  Lab 04/06/24 2202 04/07/24 0018  GLUCAP >600*  >600*   Lipid Profile: No results for input(s): CHOL, HDL, LDLCALC, TRIG, CHOLHDL, LDLDIRECT in the last 72 hours. Thyroid Function Tests: No results for input(s): TSH, T4TOTAL, FREET4, T3FREE, THYROIDAB in the last 72 hours. Anemia Panel: No results for input(s): VITAMINB12, FOLATE, FERRITIN, TIBC, IRON, RETICCTPCT in the last 72 hours. Urine analysis:    Component Value Date/Time   COLORURINE YELLOW 03/25/2024 1935   APPEARANCEUR CLEAR 03/25/2024 1935   APPEARANCEUR Clear 10/15/2020 0947   LABSPEC 1.027 03/25/2024 1935   PHURINE 5.0 03/25/2024 1935   GLUCOSEU >=500 (A) 03/25/2024 1935   HGBUR SMALL (A) 03/25/2024 1935   BILIRUBINUR NEGATIVE 03/25/2024 1935   BILIRUBINUR Negative 10/15/2020 0947  KETONESUR 20 (A) 03/25/2024 1935   PROTEINUR 100 (A) 03/25/2024 1935   UROBILINOGEN >8.0 (H) 05/06/2011 2316   NITRITE NEGATIVE 03/25/2024 1935   LEUKOCYTESUR NEGATIVE 03/25/2024 1935   Sepsis Labs: @LABRCNTIP (procalcitonin:4,lacticidven:4) )No results found for this or any previous visit (from the past 240 hours).   Radiological Exams on Admission: No results found.  EKG: Independently reviewed. Sinus tachycardia, rate 126.   Assessment/Plan   1. HHS; type II DM  - A1c was 11.6% one year ago; he was prescribed Lantus  at that time but never started it  - Continue IVF hydration and IV insulin  infusion with frequent CBGs and serial chem panels, consult diabetes coordinator    2. Hypertensive urgency  - BP severely elevated without target organ damage; he had been out of lisinopril  and metoprolol  - Continue Norvasc , resume metoprolol  with dose now, resume lisinopril  in AM after he has been hydrated, use labetalol  as-needed     DVT prophylaxis: Lovenox   Code Status: Full  Level of Care: Level of care: Stepdown Family Communication: None present  Disposition Plan:  Patient is from: Home  Anticipated d/c is to: Home  Anticipated d/c date is:  04/08/24  Patient currently: Pending glycemic-control, BP-control, diabetes education  Consults called: None  Admission status: Inpatient     Evalene GORMAN Sprinkles, MD [facility]  04/07/2024, 1:08 AM

## 2024-04-08 DIAGNOSIS — E1100 Type 2 diabetes mellitus with hyperosmolarity without nonketotic hyperglycemic-hyperosmolar coma (NKHHC): Secondary | ICD-10-CM

## 2024-04-08 LAB — GLUCOSE, CAPILLARY
Glucose-Capillary: 247 mg/dL — ABNORMAL HIGH (ref 70–99)
Glucose-Capillary: 283 mg/dL — ABNORMAL HIGH (ref 70–99)
Glucose-Capillary: 270 mg/dL — ABNORMAL HIGH (ref 70–99)
Glucose-Capillary: 324 mg/dL — ABNORMAL HIGH (ref 70–99)

## 2024-04-08 LAB — BASIC METABOLIC PANEL WITH GFR
Anion gap: 13 (ref 5–15)
BUN: 16 mg/dL (ref 6–20)
CO2: 20 mmol/L — ABNORMAL LOW (ref 22–32)
Calcium: 9.1 mg/dL (ref 8.9–10.3)
Chloride: 105 mmol/L (ref 98–111)
Creatinine, Ser: 0.97 mg/dL (ref 0.61–1.24)
GFR, Estimated: >60 mL/min (ref >60)
Glucose, Bld: 204 mg/dL — ABNORMAL HIGH (ref 70–99)
Potassium: 3.2 mmol/L — ABNORMAL LOW (ref 3.5–5.1)
Sodium: 138 mmol/L (ref 135–145)

## 2024-04-08 LAB — CBC
HCT: 46.7 % (ref 39.0–52.0)
Hemoglobin: 15.7 g/dL (ref 13.0–17.0)
MCH: 28.5 pg (ref 26.0–34.0)
MCHC: 33.6 g/dL (ref 30.0–36.0)
MCV: 84.9 fL (ref 80.0–100.0)
Platelets: 211 K/uL (ref 150–400)
RBC: 5.5 MIL/uL (ref 4.22–5.81)
RDW: 13.6 % (ref 11.5–15.5)
WBC: 7.6 K/uL (ref 4.0–10.5)
nRBC: 0 % (ref 0.0–0.2)

## 2024-04-08 MED ORDER — POTASSIUM CHLORIDE CRYS ER 20 MEQ PO TBCR
40.0000 meq | EXTENDED_RELEASE_TABLET | Freq: Once | ORAL | Status: AC
  Administered 2024-04-08: 40 meq via ORAL
  Filled 2024-04-08: qty 2

## 2024-04-08 MED ORDER — HYDROCORTISONE 1 % EX CREA
TOPICAL_CREAM | Freq: Four times a day (QID) | CUTANEOUS | Status: DC
  Filled 2024-04-08: qty 28

## 2024-04-08 MED ORDER — NAPROXEN SODIUM 220 MG PO TABS: 220.0000 mg | ORAL_TABLET | Freq: Two times a day (BID) | ORAL | Status: DC | PRN

## 2024-04-08 MED ORDER — INSULIN ASPART PROT & ASPART (70-30 MIX) 100 UNIT/ML ~~LOC~~ SUSP
10.0000 [IU] | Freq: Two times a day (BID) | SUBCUTANEOUS | Status: DC
  Administered 2024-04-08 – 2024-04-09 (×3): 10 [IU] via SUBCUTANEOUS

## 2024-04-08 MED ORDER — HYDRALAZINE HCL 25 MG PO TABS
25.0000 mg | ORAL_TABLET | Freq: Three times a day (TID) | ORAL | Status: DC
  Administered 2024-04-08 – 2024-04-09 (×4): 25 mg via ORAL
  Filled 2024-04-08 (×4): qty 1

## 2024-04-08 MED ORDER — CHLORHEXIDINE GLUCONATE CLOTH 2 % EX PADS
6.0000 | MEDICATED_PAD | Freq: Every day | CUTANEOUS | Status: DC
  Administered 2024-04-08: 6 via TOPICAL

## 2024-04-08 MED ORDER — ACETAMINOPHEN 325 MG PO TABS: 650.0000 mg | ORAL_TABLET | Freq: Four times a day (QID) | ORAL | Status: DC | PRN

## 2024-04-08 MED ORDER — METOPROLOL SUCCINATE ER 50 MG PO TB24
50.0000 mg | ORAL_TABLET | Freq: Every day | ORAL | Status: DC
  Administered 2024-04-08 – 2024-04-09 (×2): 50 mg via ORAL
  Filled 2024-04-08: qty 2
  Filled 2024-04-08: qty 1

## 2024-04-08 MED ORDER — NAPROXEN 500 MG PO TABS
250.0000 mg | ORAL_TABLET | Freq: Two times a day (BID) | ORAL | Status: DC | PRN
  Administered 2024-04-08 (×2): 250 mg via ORAL
  Filled 2024-04-08 (×3): qty 1

## 2024-04-08 NOTE — Progress Notes (Signed)
 PROGRESS NOTE    Jeffrey Potts  FMW:[id number] DOB: September 29, 1970 DOA: 04/06/2024 PCP: Dettinger, Fonda LABOR, MD    Brief Narrative:  Jeffrey Potts is a 53 y.o. male with medical history significant for hypertension and diabetes mellitus who presents with polyuria, polydipsia, lightheadedness, and leg cramps.   Patient has been experiencing polyuria and polydipsia for roughly 2 weeks and then developed lightheadedness and leg cramps last night.   He has been taking metformin  and amlodipine  at home but has been out of his other medications including lisinopril  and metoprolol  for months.    He was admitted 1 year ago for HHS and was prescribed 10 units Lantus  daily on discharge but reports that the pharmacy did not have it available and he has never taken any insulin .   Assessment and Plan: HHS; type II DM  - A1c was 11.6% one year ago; he was prescribed Lantus  at that time but never started it  - After discussion with diabetic coordinator, plan to d/c on Humalog  75//25 insulin  ($35 per prescription for 1500 units -in 5 pens) 75/25 8 units bid would equal 11.2 units basal + 4 units meal coverage.    Hypertensive urgency  - BP severely elevated without target organ damage; he had been out of lisinopril  and metoprolol  - Continue Norvasc , resume metoprolol  with increased dose now, resume lisinopril -add hydralazine   Hypokalemia - Replete  DVT prophylaxis: enoxaparin  (LOVENOX ) injection 40 mg Start: 04/07/24 1000    Code Status: Full Code  Disposition Plan:  Level of care: Telemetry Status is: Inpatient     Consultants:    Subjective: Continues to complain of slight headache  Objective: Vitals:   04/08/24 0900 04/08/24 1000 04/08/24 1100 04/08/24 1200  BP: (!) 169/71 (!) 187/96 (!) 186/115   Pulse: 85 96 85   Resp: 17 (!) 22 16   Temp:    97.8 F (36.6 C)  TempSrc:    Oral  SpO2: 94% 96% 96%   Weight:      Height:        Intake/Output Summary (Last 24  hours) at 04/08/2024 1204 Last data filed at 04/07/2024 2100 Gross per 24 hour  Intake 485.47 ml  Output 900 ml  Net -414.53 ml   Filed Weights   04/07/24 0426  Weight: 112.2 kg    Examination:   General: Appearance:    Obese male in no acute distress     Lungs:      respirations unlabored  Heart:    Normal heart rate.    MS:   All extremities are intact.    Neurologic:   Awake, alert, oriented x 3. No apparent focal neurological           defect.        Data Reviewed: I have personally reviewed following labs and imaging studies  CBC: Recent Labs  Lab 04/06/24 2218 04/07/24 0709 04/08/24 0249  WBC 7.8 8.4 7.6  HGB 16.6 15.9 15.7  HCT 48.9 45.9 46.7  MCV 83.0 83.0 84.9  PLT 243 235 211   Basic Metabolic Panel: Recent Labs  Lab 04/06/24 2218 04/06/24 2231 04/07/24 0709 04/08/24 0249  NA 130*  --  141 138  K 3.6  --  3.6 3.2*  CL 95*  --  108 105  CO2 18*  --  21* 20*  GLUCOSE 652*  --  108* 204*  BUN 13  --  15 16  CREATININE 1.24  --  0.93 0.97  CALCIUM 9.8  --  9.6 9.1  MG  --  2.1  --   --    GFR: Estimated Creatinine Clearance: 108 mL/min (by C-G formula based on SCr of 0.97 mg/dL). Liver Function Tests: Recent Labs  Lab 04/06/24 2218  AST 20  ALT 43  ALKPHOS 147*  BILITOT 0.9  PROT 7.1  ALBUMIN 4.2   No results for input(s): LIPASE, AMYLASE in the last 168 hours. No results for input(s): AMMONIA in the last 168 hours. Coagulation Profile: No results for input(s): INR, PROTIME in the last 168 hours. Cardiac Enzymes: No results for input(s): CKTOTAL, CKMB, CKMBINDEX, TROPONINI in the last 168 hours. BNP (last 3 results) No results for input(s): PROBNP in the last 8760 hours. HbA1C: Recent Labs    04/06/24 2220  HGBA1C 9.7*   CBG: Recent Labs  Lab 04/07/24 1732 04/07/24 1913 04/07/24 2202 04/08/24 0727 04/08/24 1141  GLUCAP 367* 288* 231* 247* 283*   Lipid Profile: No results for input(s): CHOL, HDL,  LDLCALC, TRIG, CHOLHDL, LDLDIRECT in the last 72 hours. Thyroid Function Tests: No results for input(s): TSH, T4TOTAL, FREET4, T3FREE, THYROIDAB in the last 72 hours. Anemia Panel: No results for input(s): VITAMINB12, FOLATE, FERRITIN, TIBC, IRON, RETICCTPCT in the last 72 hours. Sepsis Labs: No results for input(s): PROCALCITON, LATICACIDVEN in the last 168 hours.  No results found for this or any previous visit (from the past 240 hours).       Radiology Studies: No results found.      Scheduled Meds:  amLODipine   10 mg Oral Daily   aspirin  EC  81 mg Oral Daily   Chlorhexidine  Gluconate Cloth  6 each Topical Daily   enoxaparin  (LOVENOX ) injection  40 mg Subcutaneous Q24H   hydrALAZINE   25 mg Oral Q8H   insulin  aspart  0-15 Units Subcutaneous TID WC   insulin  aspart  0-5 Units Subcutaneous QHS   insulin  aspart protamine- aspart  10 Units Subcutaneous BID WC   lisinopril   40 mg Oral Daily   metFORMIN   500 mg Oral BID WC   metoprolol  succinate  50 mg Oral Daily   Continuous Infusions:     LOS: 1 day    Time spent: 45 minutes spent on chart review, discussion with nursing staff, consultants, updating family and interview/physical exam; more than 50% of that time was spent in counseling and/or coordination of care.    Harlene RAYMOND Bowl, DO [facility] Available via Epic secure chat 7am-7pm After these hours, please refer to coverage provider listed on [email or web address] 04/08/2024, 12:04 PM

## 2024-04-08 NOTE — Plan of Care (Signed)
  Problem: Education: Goal: Ability to describe self-care measures that may prevent or decrease complications (Diabetes Survival Skills Education) will improve Outcome: Progressing Goal: Individualized Educational Video(s) Outcome: Progressing   Problem: Coping: Goal: Ability to adjust to condition or change in health will improve Outcome: Progressing   Problem: Fluid Volume: Goal: Ability to maintain a balanced intake and output will improve Outcome: Progressing   Problem: Health Behavior/Discharge Planning: Goal: Ability to identify and utilize available resources and services will improve Outcome: Progressing Goal: Ability to manage health-related needs will improve Outcome: Progressing   Problem: Metabolic: Goal: Ability to maintain appropriate glucose levels will improve Outcome: Progressing   Problem: Nutritional: Goal: Maintenance of adequate nutrition will improve Outcome: Progressing Goal: Progress toward achieving an optimal weight will improve Outcome: Progressing   Problem: Skin Integrity: Goal: Risk for impaired skin integrity will decrease Outcome: Progressing   Problem: Tissue Perfusion: Goal: Adequacy of tissue perfusion will improve Outcome: Progressing   Problem: Education: Goal: Ability to describe self-care measures that may prevent or decrease complications (Diabetes Survival Skills Education) will improve Outcome: Progressing Goal: Individualized Educational Video(s) Outcome: Progressing   Problem: Cardiac: Goal: Ability to maintain an adequate cardiac output will improve Outcome: Progressing   Problem: Health Behavior/Discharge Planning: Goal: Ability to identify and utilize available resources and services will improve Outcome: Progressing Goal: Ability to manage health-related needs will improve Outcome: Progressing

## 2024-04-09 LAB — GLUCOSE, CAPILLARY
Glucose-Capillary: 279 mg/dL — ABNORMAL HIGH (ref 70–99)
Glucose-Capillary: 186 mg/dL — ABNORMAL HIGH (ref 70–99)
Glucose-Capillary: 227 mg/dL — ABNORMAL HIGH (ref 70–99)

## 2024-04-09 LAB — BASIC METABOLIC PANEL WITH GFR
Anion gap: 12 (ref 5–15)
BUN: 18 mg/dL (ref 6–20)
CO2: 21 mmol/L — ABNORMAL LOW (ref 22–32)
Calcium: 9.3 mg/dL (ref 8.9–10.3)
Chloride: 105 mmol/L (ref 98–111)
Creatinine, Ser: 0.98 mg/dL (ref 0.61–1.24)
GFR, Estimated: >60 mL/min (ref >60)
Glucose, Bld: 157 mg/dL — ABNORMAL HIGH (ref 70–99)
Potassium: 3.4 mmol/L — ABNORMAL LOW (ref 3.5–5.1)
Sodium: 139 mmol/L (ref 135–145)

## 2024-04-09 LAB — CBC
HCT: 44.4 % (ref 39.0–52.0)
Hemoglobin: 15.3 g/dL (ref 13.0–17.0)
MCH: 29.3 pg (ref 26.0–34.0)
MCHC: 34.5 g/dL (ref 30.0–36.0)
MCV: 84.9 fL (ref 80.0–100.0)
Platelets: 190 K/uL (ref 150–400)
RBC: 5.23 MIL/uL (ref 4.22–5.81)
RDW: 13.7 % (ref 11.5–15.5)
WBC: 7.3 K/uL (ref 4.0–10.5)
nRBC: 0 % (ref 0.0–0.2)

## 2024-04-09 MED ORDER — INSULIN LISPRO PROT & LISPRO (75-25 MIX) 100 UNIT/ML KWIKPEN: 10.0000 [IU] | PEN_INJECTOR | Freq: Two times a day (BID) | SUBCUTANEOUS | 1 refills | Status: AC

## 2024-04-09 MED ORDER — PEN NEEDLES 31G X 5 MM MISC: 1.0000 | Freq: Three times a day (TID) | 0 refills | Status: AC

## 2024-04-09 MED ORDER — AMLODIPINE BESYLATE 10 MG PO TABS: 10.0000 mg | ORAL_TABLET | Freq: Every day | ORAL | 1 refills | Status: AC

## 2024-04-09 MED ORDER — LANCETS MISC: 1.0000 | Freq: Three times a day (TID) | 0 refills | Status: AC

## 2024-04-09 MED ORDER — NYSTATIN 100000 UNIT/GM EX POWD
Freq: Three times a day (TID) | CUTANEOUS | Status: DC
  Filled 2024-04-09: qty 15

## 2024-04-09 MED ORDER — LANCET DEVICE MISC: 1.0000 | Freq: Three times a day (TID) | 0 refills | Status: AC

## 2024-04-09 MED ORDER — HYDRALAZINE HCL 25 MG PO TABS: 25.0000 mg | ORAL_TABLET | Freq: Three times a day (TID) | ORAL | 1 refills | Status: AC

## 2024-04-09 MED ORDER — FLUCONAZOLE IN SODIUM CHLORIDE 200-0.9 MG/100ML-% IV SOLN
200.0000 mg | Freq: Once | INTRAVENOUS | Status: AC
  Administered 2024-04-09: 200 mg via INTRAVENOUS
  Filled 2024-04-09: qty 100

## 2024-04-09 MED ORDER — BLOOD GLUCOSE MONITORING SUPPL DEVI: 1.0000 | Freq: Three times a day (TID) | 0 refills | Status: AC

## 2024-04-09 MED ORDER — METOPROLOL SUCCINATE ER 50 MG PO TB24: 50.0000 mg | ORAL_TABLET | Freq: Every day | ORAL | 1 refills | Status: AC

## 2024-04-09 MED ORDER — POTASSIUM CHLORIDE CRYS ER 20 MEQ PO TBCR
40.0000 meq | EXTENDED_RELEASE_TABLET | Freq: Once | ORAL | Status: AC
  Administered 2024-04-09: 40 meq via ORAL
  Filled 2024-04-09: qty 2

## 2024-04-09 MED ORDER — BLOOD GLUCOSE TEST VI STRP: 1.0000 | ORAL_STRIP | Freq: Three times a day (TID) | 0 refills | Status: AC

## 2024-04-09 MED ORDER — NYSTATIN 100000 UNIT/GM EX POWD: Freq: Three times a day (TID) | CUTANEOUS | 0 refills | Status: AC

## 2024-04-09 MED ORDER — LISINOPRIL 40 MG PO TABS: 40.0000 mg | ORAL_TABLET | Freq: Every day | ORAL | 1 refills | Status: AC

## 2024-04-09 NOTE — TOC Initial Note (Signed)
 Transition of Care [facility]) - Initial/Assessment Note   Patient Details  Name: Jeffrey Potts MRN: [id number] Date of Birth: [birth year]  Transition of Care [facility]) CM/SW Contact:    Duwaine GORMAN Aran, LCSW Phone Number: 04/09/2024, 1:03 PM  Clinical Narrative: Care management consulted for new PCP as patient is uninsured and cannot afford to see his current PCP. Patient reported he cannot afford the $150 self pay to see his PCP, so he been unable to get his medications as he cannot see his doctor. Patient agreeable to CSW trying to find a sliding scale PCP for the patient at another [facility] clinic. CSW attempted to schedule patient at [facility] through the call center, but was informed that patient will need a transfer of care appointment rather than a hospital follow up visit since he is already connected to a [facility] PCP. Clinic will follow up with the patient. CSW updated patient.  Expected Discharge Plan: Home/Self Care Barriers to Discharge: Continued Medical Work up, Inadequate or no insurance  Patient Goals and CMS Choice Patient states their goals for this hospitalization and ongoing recovery are:: Find affordable PCP Choice offered to / list presented to : NA  Expected Discharge Plan and Services In-house Referral: Clinical Social Work, PCP / Information systems manager Acute Care Choice: NA Living arrangements for the past 2 months: Single Family Home            DME Arranged: N/A DME Agency: NA  Prior Living Arrangements/Services Living arrangements for the past 2 months: Single Family Home Lives with:: Self Patient language and need for interpreter reviewed:: Yes Do you feel safe going back to the place where you live?: Yes      Need for Family Participation in Patient Care: No (Comment) Care giver support system in place?: Yes (comment) Criminal Activity/Legal Involvement Pertinent to Current Situation/Hospitalization: No - Comment as needed  Activities of Daily  Living ADL Screening (condition at time of admission) Independently performs ADLs?: Yes (appropriate for developmental age) Is the patient deaf or have difficulty hearing?: Yes Does the patient have difficulty seeing, even when wearing glasses/contacts?: No Does the patient have difficulty concentrating, remembering, or making decisions?: No  Emotional Assessment Appearance:: Appears younger than stated age Attitude/Demeanor/Rapport: Engaged Affect (typically observed): Accepting Orientation: : Oriented to Self, Oriented to Place, Oriented to  Time, Oriented to Situation Alcohol / Substance Use: Not Applicable Psych Involvement: No (comment)  Admission diagnosis:  DKA (diabetic ketoacidosis) (HCC) [E11.10] Hypertension, unspecified type [I10] Type 2 diabetes mellitus with hyperosmolar hyperglycemic state (HHS) (HCC) [E11.00] Patient Active Problem List   Diagnosis Date Noted   DKA (diabetic ketoacidosis) (HCC) 04/07/2024   Hyperosmolar hyperglycemic state (HHS) (HCC) 04/09/2023   Dyslipidemia 06/08/2021   Type 2 diabetes mellitus with other specified complication (HCC) 10/15/2020   Hypertension 01/15/2019   Hypertensive urgency 01/25/2015   Prolonged QT interval 01/25/2015   Morbid obesity (HCC) 01/25/2015   PCP:  Dettinger, Fonda LABOR, MD Pharmacy:   [facility] [address] [place] - [address] [place] [address] Phone: [phone] Fax: [phone]  [facility] [address], [place] - [address] [place] [address] Phone: [phone] Fax: [phone]  Social Drivers of Health (SDOH) Social History: SDOH Screenings   Food Insecurity: Patient Declined (04/07/2024)  Housing: Unknown (04/07/2024)  Transportation Needs: Patient Declined (04/07/2024)  Utilities: Not At Risk (04/07/2024)  Depression (PHQ2-9): Low Risk  (09/10/2021)  Social Connections: Unknown (12/14/2021)  Received from [facility]  Tobacco Use: Low Risk  (04/07/2024)   SDOH  Interventions:    Readmission Risk Interventions     No data to display

## 2024-04-09 NOTE — Discharge Summary (Signed)
 Physician Discharge Summary  Jeffrey Potts FMW:[id number] DOB: May 12, 1971 DOA: 04/06/2024  PCP: Dettinger, Fonda LABOR, MD  Admit date: 04/06/2024 Discharge date: 04/09/2024  Admitted From:  Discharge disposition: Home   Recommendations for Outpatient Follow-Up:   Needs compliance with blood pressure medicine as well as insulin  - Blood pressure check 1 week with PCP - BMP  Discharge Diagnosis:   Principal Problem:   Hyperosmolar hyperglycemic state (HHS) (HCC) Active Problems:   Hypertensive urgency    Discharge Condition: Improved.  Diet recommendation: Low sodium, heart healthy.  Carbohydrate-modified.   Wound care: None.  Code status: Full.   History of Present Illness:   Jeffrey Potts is a 53 y.o. male with medical history significant for hypertension and diabetes mellitus who presents with polyuria, polydipsia, lightheadedness, and leg cramps.   Patient has been experiencing polyuria and polydipsia for roughly 2 weeks and then developed lightheadedness and leg cramps last night.   He has been taking metformin  and amlodipine  at home but has been out of his other medications including lisinopril  and metoprolol  for months.    He was admitted 1 year ago for HHS and was prescribed 10 units Lantus  daily on discharge but reports that the pharmacy did not have it available and he has never taken any insulin .   He denies headache, chest pain, fevers, or chills.   Hospital Course by Problem:   HHS; type II DM  - A1c was 11.6% one year ago; he was prescribed Lantus  at that time but never started it  - After discussion with diabetic coordinator, plan to d/c on Humalog  75//25 insulin  ($35 per prescription for 1500 units -in 5 pens) 75/25 8 units bid would equal 11.2 units basal + 4 units meal coverage.-Will plan to use 10 units for better control   Hypertensive urgency  - BP severely elevated without target organ damage; he had been out of lisinopril  and  metoprolol  - Continue Norvasc , resume metoprolol  with increased dose now, resume lisinopril -add hydralazine  -Will slowly lower blood pressure over the next several weeks to months   Hypokalemia - Replete      Medical Consultants:      Discharge Exam:   Vitals:   04/09/24 0628 04/09/24 0819  BP: (!) 161/94 (!) 165/90  Pulse: 79 79  Resp:    Temp:    SpO2:     Vitals:   04/09/24 0525 04/09/24 0526 04/09/24 0628 04/09/24 0819  BP: (!) 172/98 (!) 177/107 (!) 161/94 (!) 165/90  Pulse: 77 81 79 79  Resp: 18     Temp: 97.9 F (36.6 C)     TempSrc: Oral     SpO2: 97% 94%    Weight:      Height:        General exam: Appears calm and comfortable.    The results of significant diagnostics from this hospitalization (including imaging, microbiology, ancillary and laboratory) are listed below for reference.     Procedures and Diagnostic Studies:   No results found.   Labs:   Basic Metabolic Panel: Recent Labs  Lab 04/06/24 2218 04/06/24 2231 04/07/24 0709 04/08/24 0249 04/09/24 0428  NA 130*  --  141 138 139  K 3.6  --  3.6 3.2* 3.4*  CL 95*  --  108 105 105  CO2 18*  --  21* 20* 21*  GLUCOSE 652*  --  108* 204* 157*  BUN 13  --  15 16 18   CREATININE 1.24  --  0.93 0.97 0.98  CALCIUM 9.8  --  9.6 9.1 9.3  MG  --  2.1  --   --   --    GFR Estimated Creatinine Clearance: 106.9 mL/min (by C-G formula based on SCr of 0.98 mg/dL). Liver Function Tests: Recent Labs  Lab 04/06/24 2218  AST 20  ALT 43  ALKPHOS 147*  BILITOT 0.9  PROT 7.1  ALBUMIN 4.2   No results for input(s): LIPASE, AMYLASE in the last 168 hours. No results for input(s): AMMONIA in the last 168 hours. Coagulation profile No results for input(s): INR, PROTIME in the last 168 hours.  CBC: Recent Labs  Lab 04/06/24 2218 04/07/24 0709 04/08/24 0249 04/09/24 0428  WBC 7.8 8.4 7.6 7.3  HGB 16.6 15.9 15.7 15.3  HCT 48.9 45.9 46.7 44.4  MCV 83.0 83.0 84.9 84.9  PLT 243  235 211 190   Cardiac Enzymes: No results for input(s): CKTOTAL, CKMB, CKMBINDEX, TROPONINI in the last 168 hours. BNP: Invalid input(s): POCBNP CBG: Recent Labs  Lab 04/08/24 1141 04/08/24 1613 04/08/24 2032 04/09/24 0732 04/09/24 1140  GLUCAP 283* 270* 324* 186* 227*   D-Dimer No results for input(s): DDIMER in the last 72 hours. Hgb A1c Recent Labs    04/06/24 2220  HGBA1C 9.7*   Lipid Profile No results for input(s): CHOL, HDL, LDLCALC, TRIG, CHOLHDL, LDLDIRECT in the last 72 hours. Thyroid function studies No results for input(s): TSH, T4TOTAL, T3FREE, THYROIDAB in the last 72 hours.  Invalid input(s): FREET3 Anemia work up No results for input(s): VITAMINB12, FOLATE, FERRITIN, TIBC, IRON, RETICCTPCT in the last 72 hours. Microbiology No results found for this or any previous visit (from the past 240 hours).   Discharge Instructions:   Discharge Instructions     Diet - low sodium heart healthy   Complete by: As directed    Diet Carb Modified   Complete by: As directed    Increase activity slowly   Complete by: As directed    No wound care   Complete by: As directed       Allergies as of 04/09/2024       Reactions   Mushroom Extract Complex (obsolete) Shortness Of Breath   Penicillins Other (See Comments), Anaphylaxis, Hives   Dry Throat Other reaction(s): Other (See Comments)  Dry Throat  Dry Throat   Gabapentin Rash        Medication List     STOP taking these medications    insulin  glargine 100 UNIT/ML Solostar Pen Commonly known as: LANTUS        TAKE these medications    amLODipine  10 MG tablet Commonly known as: NORVASC  Take 1 tablet (10 mg total) by mouth daily. Start taking on: April 10, 2024 What changed:  medication strength how much to take Another medication with the same name was removed. Continue taking this medication, and follow the directions you see here.    aspirin  EC 81 MG tablet Take 1 tablet (81 mg total) by mouth daily.   Blood Glucose Monitoring Suppl Devi 1 each by Does not apply route 3 (three) times daily. May dispense any manufacturer covered by patient's insurance. What changed:  when to take this additional instructions   BLOOD GLUCOSE TEST STRIPS Strp 1 each by Does not apply route 3 (three) times daily. Use as directed to check blood sugar. May dispense any manufacturer covered by patient's insurance and fits patient's device.   hydrALAZINE  25 MG tablet Commonly known as: APRESOLINE  Take 1 tablet (  25 mg total) by mouth every 8 (eight) hours.   Insulin  Lispro Prot & Lispro (75-25) 100 UNIT/ML Kwikpen Commonly known as: HumaLOG  Mix 75/25 KwikPen Inject 10 Units into the skin in the morning and at bedtime.   Lancet Device Misc 1 each by Does not apply route 3 (three) times daily. May dispense any manufacturer covered by patient's insurance.   Lancets Misc 1 each by Does not apply route 3 (three) times daily. Use as directed to check blood sugar. May dispense any manufacturer covered by patient's insurance and fits patient's device.   lisinopril  40 MG tablet Commonly known as: ZESTRIL  Take 1 tablet (40 mg total) by mouth daily.   metFORMIN  500 MG tablet Commonly known as: GLUCOPHAGE  Take 1 tablet (500 mg total) by mouth 2 (two) times daily with a meal.   metoprolol  succinate 50 MG 24 hr tablet Commonly known as: TOPROL -XL Take 1 tablet (50 mg total) by mouth daily. Take with or immediately following a meal. Start taking on: April 10, 2024 What changed:  medication strength how much to take additional instructions   naproxen  sodium 220 MG tablet Commonly known as: ALEVE  Take 220 mg by mouth as needed. For pain   nystatin  powder Commonly known as: MYCOSTATIN /NYSTOP  Apply topically 3 (three) times daily.   Pen Needles 31G X 5 MM Misc 1 each by Does not apply route 3 (three) times daily. May dispense any  manufacturer covered by patient's insurance.          Time coordinating discharge: 45 minutes  Signed:  Harlene RAYMOND Bowl DO  [facility] 04/09/2024, 1:04 PM

## 2024-04-09 NOTE — Inpatient Diabetes Management (Signed)
 Inpatient Diabetes Program Recommendations  AACE/ADA: New Consensus Statement on Inpatient Glycemic Control (2015)  Target Ranges:  Prepandial:   less than 140 mg/dL      Peak postprandial:   less than 180 mg/dL (1-2 hours)      Critically ill patients:  140 - 180 mg/dL   Lab Results  Component Value Date   GLUCAP 227 (H) 04/09/2024   HGBA1C 9.7 (H) 04/06/2024    Review of Glycemic Control  Diabetes history: DM2 Outpatient Diabetes medications: metformin  500 mg BID Current orders for Inpatient glycemic control: metformin  500 BID, 70/30 10 units BID, Novolog  0-15 TID with meals and 0-5 HS  HgbA1C - 9.7%  Inpatient Diabetes Program Recommendations:    To be d/ced on Humalog  75/25 10 units BID and metformin  500 mg BID  Spoke with pt at bedside regarding discharging home on 75/25 insulin  and metformin . Educated patient on insulin  pen use at home. Reviewed contents of insulin  flexpen starter kit. Reviewed all steps of insulin  pen including attachment of needle, 2-unit air shot, dialing up dose, giving injection, removing needle, disposal of sharps, storage of unused insulin , disposal of insulin  etc. Patient able to provide successful return demonstration. Also reviewed troubleshooting with insulin  pen. MD to give patient Rxs for insulin  pens and insulin  pen needles. Discussed healthy diet using portion control, leaving off juices, regular sodas and sweet tea. Discussed portion control, especially with potatoes, rice, pasta. Pt is not doing any exercise at present. Encouraged him to walk for 20-30 each day, if possible. Pt needs to f/u with his PCP, has not seen in several years. Instructed to take meter with him to appt so PCP can review blood sugars and make changes with meds if needed. Pt to monitor blood sugars at least 3x/day. Explained how hyperglycemia leads to damage within blood vessels which lead to the common complications seen with uncontrolled diabetes. Stressed to the patient the  importance of improving glycemic control to prevent further complications from uncontrolled diabetes. Discussed impact of nutrition, exercise, stress, sickness, and medications on diabetes control. Educated on hypoglycemia s/s and treatment. Answered all questions.  Pt appears to be motivated to make changes and take insulin  as prescribed. Encouraged exercise to control blood sugars.   Thank you. Shona Brandy, RD, LDN, CDCES Inpatient Diabetes Coordinator [phone]

## 2024-04-10 ENCOUNTER — Telehealth: Payer: Self-pay

## 2024-04-10 NOTE — Transitions of Care (Post Inpatient/ED Visit) (Signed)
   04/10/2024  Name: Sion Reinders MRN: [id number] DOB: 30-Jun-1971  Today's TOC FU Call Status: Today's TOC FU Call Status:: Unsuccessful Call (1st Attempt) Unsuccessful Call (1st Attempt) Date: 04/10/24  Attempted to reach the patient regarding the most recent Inpatient/ED visit.  Follow Up Plan: Additional outreach attempts will be made to reach the patient to complete the Transitions of Care (Post Inpatient/ED visit) call.   Shona Prow RN, CCM [place]  VBCI-Population Health RN Care Manager [phone]

## 2024-04-11 ENCOUNTER — Telehealth: Payer: Self-pay

## 2024-04-11 NOTE — Transitions of Care (Post Inpatient/ED Visit) (Signed)
   04/11/2024  Name: Jeffrey Potts MRN: [id number] DOB: [birth year]  Today's TOC FU Call Status: Today's TOC FU Call Status:: Unsuccessful Call (2nd Attempt) Unsuccessful Call (2nd Attempt) Date: 04/11/24  Attempted to reach the patient regarding the most recent Inpatient/ED visit.  Follow Up Plan: Additional outreach attempts will be made to reach the patient to complete the Transitions of Care (Post Inpatient/ED visit) call.   Alan Ee, RN, BSN, CEN [employer]- Transition of Care Team.  [facility] [phone]

## 2024-04-12 ENCOUNTER — Telehealth (INDEPENDENT_AMBULATORY_CARE_PROVIDER_SITE_OTHER): Payer: Self-pay | Admitting: Primary Care

## 2024-04-12 ENCOUNTER — Telehealth: Payer: Self-pay | Admitting: *Deleted

## 2024-04-12 NOTE — Telephone Encounter (Signed)
 Called pt to confirm appt. Pt did not answer and VM was full.

## 2024-04-12 NOTE — Transitions of Care (Post Inpatient/ED Visit) (Signed)
   04/12/2024  Name: Jeffrey Potts MRN: [id number] DOB: 1971-05-13  Today's TOC FU Call Status: Today's TOC FU Call Status:: Unsuccessful Call (3rd Attempt) Unsuccessful Call (3rd Attempt) Date: 04/12/24  Attempted to reach the patient regarding the most recent Inpatient/ED visit.  Follow Up Plan: No further outreach attempts will be made at this time. We have been unable to contact the patient.  Mliss Creed [facility], BSN RN Care Manager/ Transition of Care [place]/ [facility] [phone]

## 2024-04-13 ENCOUNTER — Inpatient Hospital Stay (INDEPENDENT_AMBULATORY_CARE_PROVIDER_SITE_OTHER): Payer: Self-pay | Admitting: Primary Care
